# Patient Record
Sex: Female | Born: 1950
Health system: Southern US, Community
[De-identification: ages and names within clinical notes are randomized; demographics above are authoritative.]

## PROBLEM LIST (undated history)

## (undated) DIAGNOSIS — K589 Irritable bowel syndrome without diarrhea: Secondary | ICD-10-CM

## (undated) DIAGNOSIS — K219 Gastro-esophageal reflux disease without esophagitis: Secondary | ICD-10-CM

## (undated) DIAGNOSIS — Z8742 Personal history of other diseases of the female genital tract: Secondary | ICD-10-CM

## (undated) DIAGNOSIS — J45909 Unspecified asthma, uncomplicated: Secondary | ICD-10-CM

## (undated) DIAGNOSIS — M109 Gout, unspecified: Secondary | ICD-10-CM

## (undated) DIAGNOSIS — G4730 Sleep apnea, unspecified: Secondary | ICD-10-CM

## (undated) DIAGNOSIS — R350 Frequency of micturition: Secondary | ICD-10-CM

## (undated) DIAGNOSIS — N393 Stress incontinence (female) (male): Secondary | ICD-10-CM

## (undated) DIAGNOSIS — N951 Menopausal and female climacteric states: Secondary | ICD-10-CM

## (undated) DIAGNOSIS — J3089 Other allergic rhinitis: Secondary | ICD-10-CM

## (undated) DIAGNOSIS — Z9289 Personal history of other medical treatment: Secondary | ICD-10-CM

## (undated) HISTORY — DX: Irritable bowel syndrome, unspecified: K58.9

## (undated) HISTORY — DX: Stress incontinence (female) (male): N39.3

## (undated) HISTORY — DX: Personal history of other medical treatment: Z92.89

## (undated) HISTORY — DX: Sleep apnea, unspecified: G47.30

## (undated) HISTORY — PX: BREAST BIOPSY: SHX20

## (undated) HISTORY — DX: Menopausal and female climacteric states: N95.1

## (undated) HISTORY — PX: TONSILLECTOMY AND ADENOIDECTOMY: SHX28

## (undated) HISTORY — PX: HYSTEROSCOPY WITH D & C: SHX1775

---

## 1999-04-06 ENCOUNTER — Other Ambulatory Visit: Admission: RE | Admit: 1999-04-06 | Discharge: 1999-04-06 | Payer: Self-pay | Admitting: *Deleted

## 2000-05-18 ENCOUNTER — Encounter: Payer: Self-pay | Admitting: Gastroenterology

## 2000-05-18 ENCOUNTER — Ambulatory Visit (HOSPITAL_COMMUNITY): Admission: RE | Admit: 2000-05-18 | Discharge: 2000-05-18 | Payer: Self-pay | Admitting: Gastroenterology

## 2000-06-02 ENCOUNTER — Other Ambulatory Visit: Admission: RE | Admit: 2000-06-02 | Discharge: 2000-06-02 | Payer: Self-pay | Admitting: *Deleted

## 2001-02-07 ENCOUNTER — Other Ambulatory Visit: Admission: RE | Admit: 2001-02-07 | Discharge: 2001-02-07 | Payer: Self-pay | Admitting: *Deleted

## 2001-04-03 ENCOUNTER — Encounter: Admission: RE | Admit: 2001-04-03 | Discharge: 2001-04-03 | Payer: Self-pay | Admitting: Family Medicine

## 2001-04-03 ENCOUNTER — Encounter: Payer: Self-pay | Admitting: Family Medicine

## 2002-04-03 ENCOUNTER — Other Ambulatory Visit: Admission: RE | Admit: 2002-04-03 | Discharge: 2002-04-03 | Payer: Self-pay | Admitting: *Deleted

## 2003-04-17 ENCOUNTER — Other Ambulatory Visit: Admission: RE | Admit: 2003-04-17 | Discharge: 2003-04-17 | Payer: Self-pay | Admitting: *Deleted

## 2004-04-20 ENCOUNTER — Other Ambulatory Visit: Admission: RE | Admit: 2004-04-20 | Discharge: 2004-04-20 | Payer: Self-pay | Admitting: Obstetrics and Gynecology

## 2005-11-21 DIAGNOSIS — N951 Menopausal and female climacteric states: Secondary | ICD-10-CM

## 2005-11-21 HISTORY — DX: Menopausal and female climacteric states: N95.1

## 2006-02-02 ENCOUNTER — Other Ambulatory Visit: Admission: RE | Admit: 2006-02-02 | Discharge: 2006-02-02 | Payer: Self-pay | Admitting: Obstetrics & Gynecology

## 2006-05-03 DIAGNOSIS — Z9289 Personal history of other medical treatment: Secondary | ICD-10-CM

## 2006-05-03 HISTORY — DX: Personal history of other medical treatment: Z92.89

## 2007-02-05 ENCOUNTER — Other Ambulatory Visit: Admission: RE | Admit: 2007-02-05 | Discharge: 2007-02-05 | Payer: Self-pay | Admitting: Obstetrics & Gynecology

## 2007-11-29 ENCOUNTER — Other Ambulatory Visit: Admission: RE | Admit: 2007-11-29 | Discharge: 2007-11-29 | Payer: Self-pay | Admitting: Obstetrics & Gynecology

## 2008-12-01 ENCOUNTER — Other Ambulatory Visit: Admission: RE | Admit: 2008-12-01 | Discharge: 2008-12-01 | Payer: Self-pay | Admitting: Obstetrics and Gynecology

## 2009-01-19 HISTORY — PX: COLONOSCOPY: SHX174

## 2009-05-04 ENCOUNTER — Encounter: Admission: RE | Admit: 2009-05-04 | Discharge: 2009-05-04 | Payer: Self-pay | Admitting: Gastroenterology

## 2009-07-20 ENCOUNTER — Encounter: Payer: Self-pay | Admitting: Obstetrics and Gynecology

## 2009-07-20 ENCOUNTER — Ambulatory Visit (HOSPITAL_COMMUNITY): Admission: RE | Admit: 2009-07-20 | Discharge: 2009-07-20 | Payer: Self-pay | Admitting: Obstetrics and Gynecology

## 2010-03-04 IMAGING — RF DG BE W/ AIR HIGH DENSITY
17 of 24 series · 17 of 24 positions shown · IV contrast (agent unspecified)
Comparison: None.

CLINICAL DATA: Incomplete colonoscopy.  Intermittent constipation.

AIR-CONTRAST BARIUM ENEMA
TECHNIQUE: After obtaining a scout radiograph air-contrast barium
enema was performed under fluoroscopy using high-density barium.
Fluoroscopy Time: 1.7 minutes.
Contrast: Double contrast.

[Series 1: run · 1 of 1 slices shown (1 of 15)]
[im 1/1]
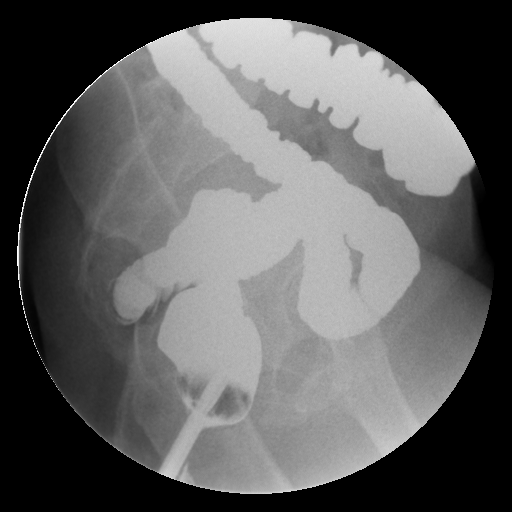

[Series 3: run · 1 of 1 slices shown (2 of 15)]
[im 1/1]
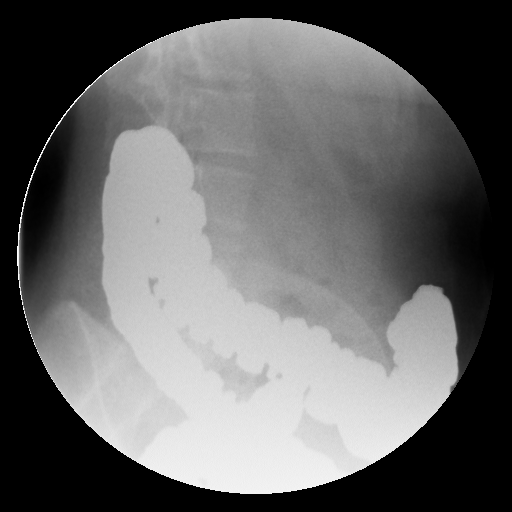

[Series 4: run · 1 of 1 slices shown (3 of 15)]
[im 1/1]
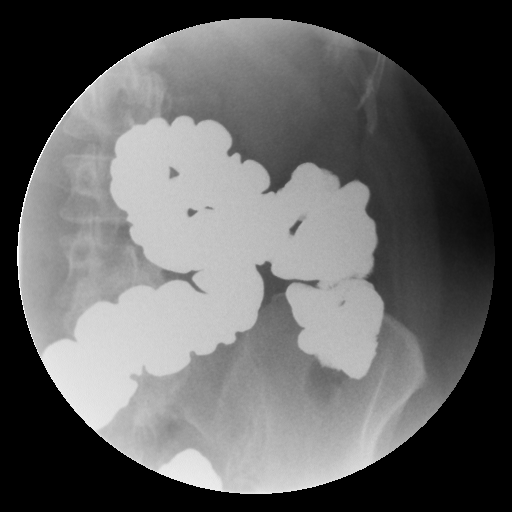

[Series 5: run · 1 of 1 slices shown (4 of 15)]
[im 1/1]
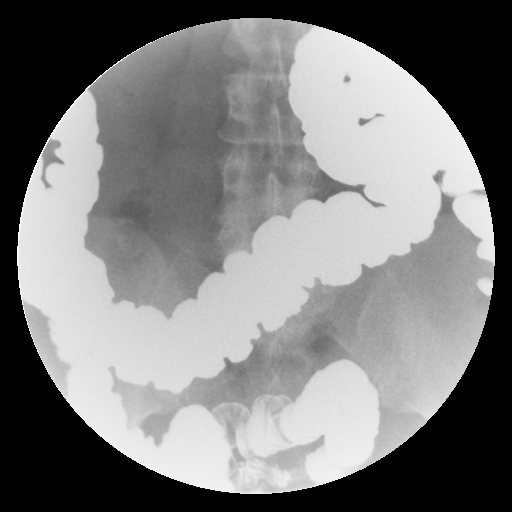

[Series 7: run · 1 of 1 slices shown (5 of 15)]
[im 1/1]
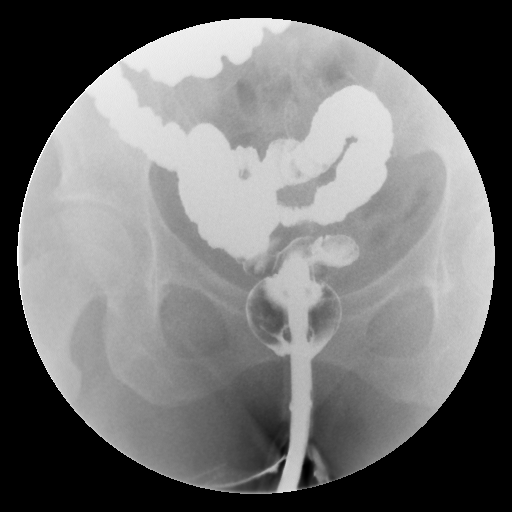

[Series 8: run · 1 of 1 slices shown (6 of 15)]
[im 1/1]
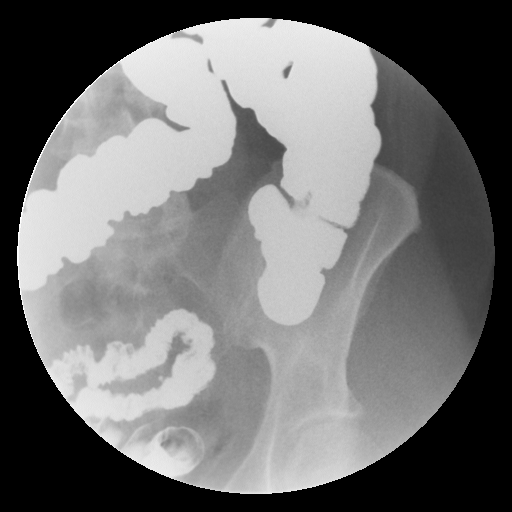

[Series 10: run · 1 of 1 slices shown (7 of 15)]
[im 1/1]
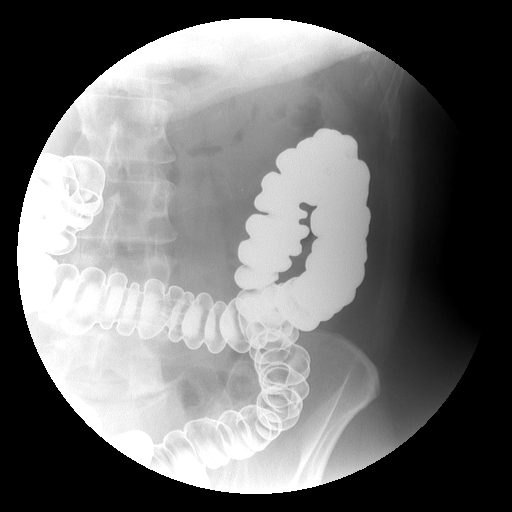

[Series 11: run · 1 of 1 slices shown (8 of 15)]
[im 1/1]
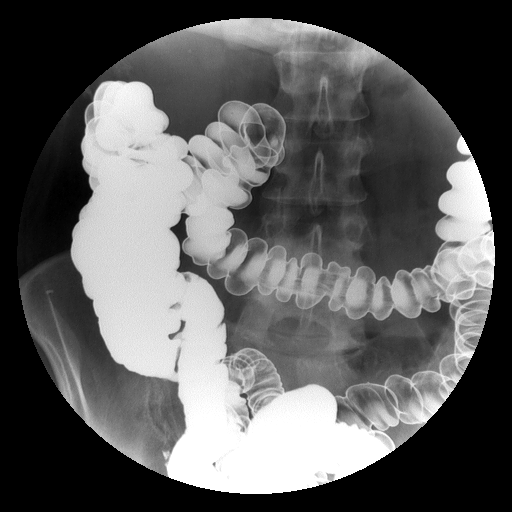

[Series 13: run · 1 of 1 slices shown (9 of 15)]
[im 1/1]
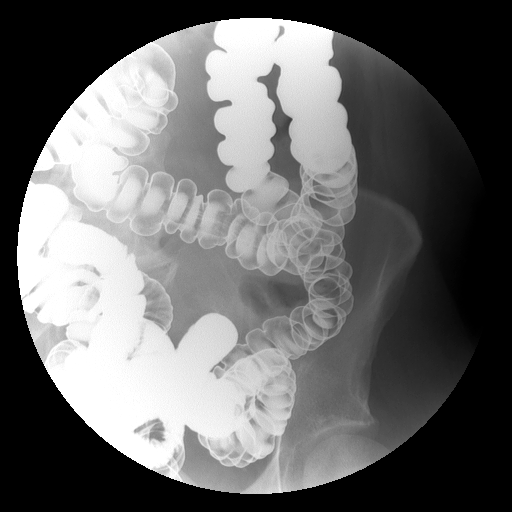

[Series 14: run · 1 of 1 slices shown (10 of 15)]
[im 1/1]
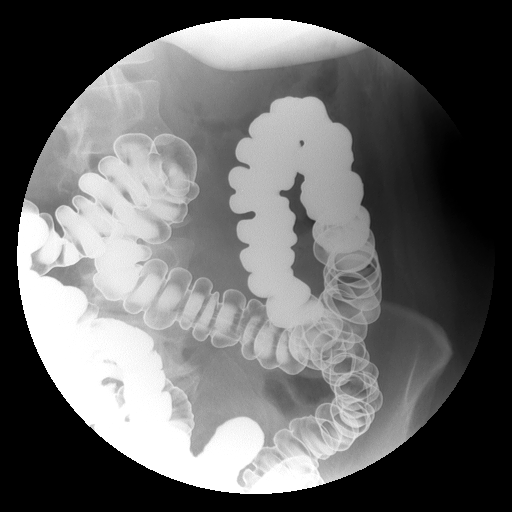

[Series 15: run · 1 of 1 slices shown (11 of 15)]
[im 1/1]
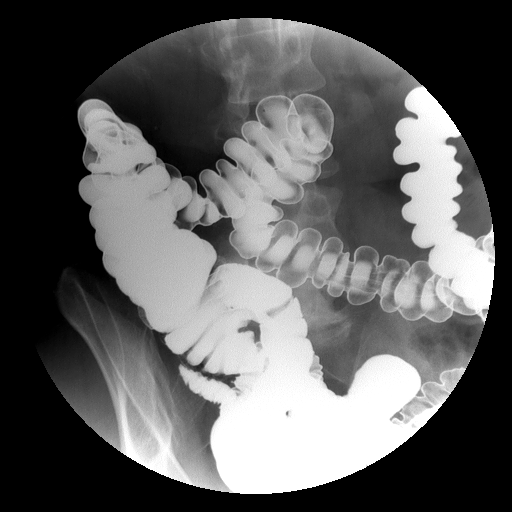

[Series 17: run · 1 of 1 slices shown (12 of 15)]
[im 1/1]
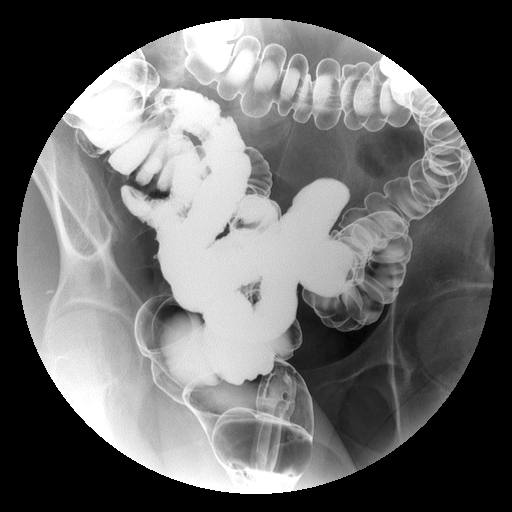

[Series 18: run · 1 of 1 slices shown (13 of 15)]
[im 1/1]
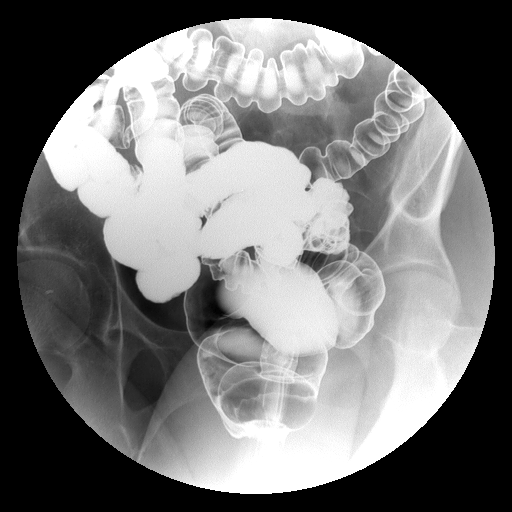

[Series 20: run · 1 of 1 slices shown (14 of 15)]
[im 1/1]
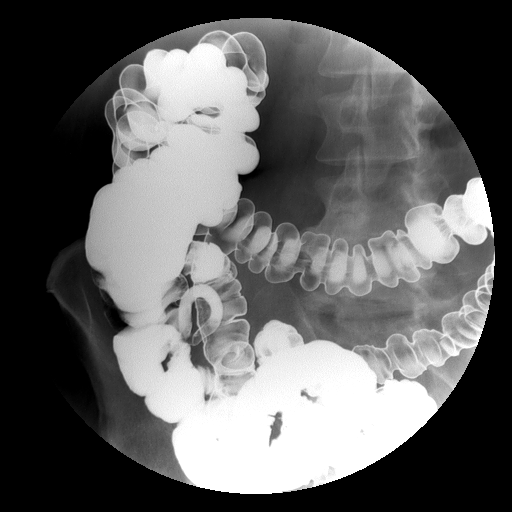

[Series 21: run · 1 of 1 slices shown (15 of 15)]
[im 1/1]
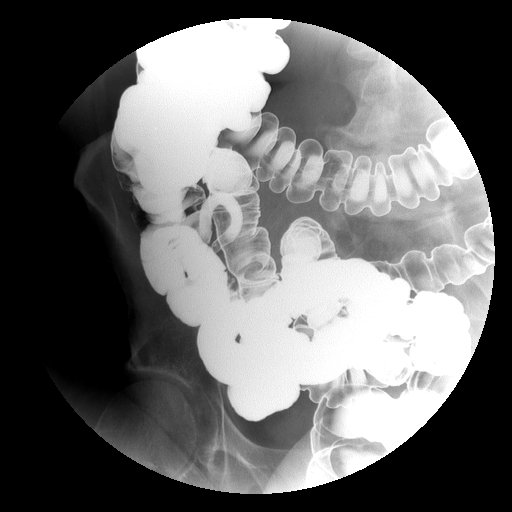

[Series 1001: view not recorded · 0.20mm/px · 1 of 1 slices shown (1 of 2)]
[im 1/1]
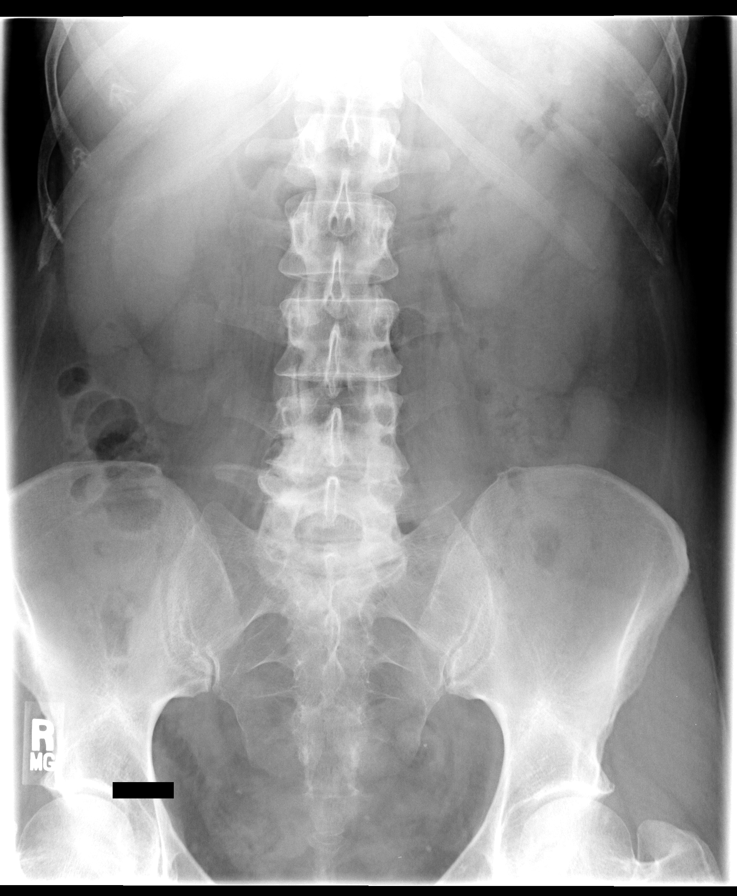

[Series 1003: view not recorded · 0.20mm/px · 1 of 1 slices shown (2 of 2)]
[im 1/1]
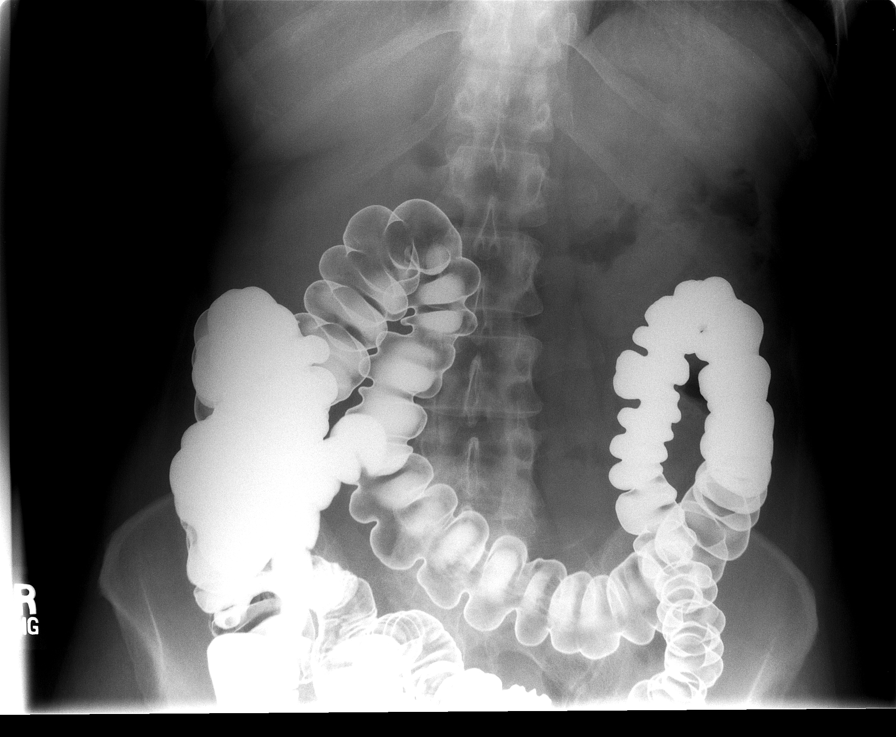

[17 of 24 positions shown; findings below may reference images not displayed]

FINDINGS: Scout view reveals degenerative changes lower lumbar
spine. Barium and air were instilled per rectum with free flow to
the level of the cecum with reflux into a normal-appearing appendix
and terminal ileum.  Secondary to incompetent ileocecal valve and
the fact that the  patient had difficulty retaining air/barium,
adequate distended views were not able to be obtained.  Taking this
limitation to account, no colonic obstructing lesion, constricting
lesion or mucosal abnormality is noted.  Small number scattered
colonic diverticula.
IMPRESSION: Slightly limited exam as noted above.  Small number of colonic
diverticula otherwise no discrete colonic abnormality detected.

## 2011-02-26 LAB — CBC
HCT: 42.7 % (ref 36.0–46.0)
Hemoglobin: 14.4 g/dL (ref 12.0–15.0)
MCHC: 33.7 g/dL (ref 30.0–36.0)
MCV: 89.7 fL (ref 78.0–100.0)
Platelets: 197 10*3/uL (ref 150–400)
RBC: 4.76 MIL/uL (ref 3.87–5.11)
RDW: 12.9 % (ref 11.5–15.5)
WBC: 6.9 10*3/uL (ref 4.0–10.5)

## 2011-04-05 NOTE — Op Note (Signed)
Kristen Anderson, Kristen Anderson              ACCOUNT NO.:  [id number]   MEDICAL RECORD NO.:  [id number]          PATIENT TYPE:  AMB   LOCATION:  [facility]                           FACILITY:  [facility]   PHYSICIAN:  Cynthia P. Romine, M.D.DATE OF BIRTH:  03/06/1951   DATE OF PROCEDURE:  07/20/2009  DATE OF DISCHARGE:  07/20/2009                               OPERATIVE REPORT   PREOPERATIVE DIAGNOSES:  Postmenopausal bleeding, endocervical polyps.   POSTOPERATIVE DIAGNOSES:  Postmenopausal bleeding, endocervical polyps.   PATHOLOGY:  Pending.   PROCEDURES:  Hysteroscopy with resection of endocervical polyp and  dilatation and curettage.   SURGEON:  Cynthia P. Romine, MD   ANESTHESIA:  General by LMA.   ESTIMATED BLOOD LOSS:  50 mL.   COMPLICATIONS:  None.   SORBITOL DEFICIT:  40 mL.   PROCEDURE IN DETAILS:  The patient was taken to the operating room and  after induction of adequate general anesthesia by LMA, she was placed in  a dorsal lithotomy position and prepped and draped in usual fashion.  Catheterization was not carried out as the patient had just weighted  spontaneously before coming back to the OR.  A posterior weighted and  anterior Sims retractor were placed.  The cervix was grasped on its  anterior lip with a single-tooth tenaculum.  Paracervical block was  instituted by injecting 10 mL of 1% Xylocaine at each of 3 and 9  o'clock.  The cervix was dilated to a #31 Pratt.  The uterus sounded to  9 cm.  The operative hysteroscope was introduced.  Glycine was used as a  distention medium with a pressure pump set at 80 mmHg.  Hysteroscopy  revealed the endometrial cavity to be clean and quite atrophic looking,  photographic documentation was taken.  Upon withdrawing the scope, the  endocervical cavity appeared clear until right at the external os.  There was a tuft of sessile endocervical polyps that looked precisely  like we had seen them on the sonohysterogram in the office.  Predominantly, they originated from the anterior surface of the  endocervical canal right at the os.  It was almost difficult to see them  without having the scope withdraw from the cervix, because of how distal  they were.  Single-loop cautery was used to remove these polyps and a  specimen was sent to Pathology.  The scope was then withdrawn.  Gentle  sharp curettage was then done and the specimen sent with the  endocervical polyps to Pathology.  Because of cautery being used, the  cervix so close to os, there was concern about the healing closed,  therefore a pediatric Foley catheter was inserted into the os.  The tip  of the catheter was cut off though just the balloon went into the  endocervix.  The balloon was inflated with 1.5 mL of saline.  At the  other end of the catheter, the tip that would  ordinarily hook up to a bag was cut off to decrease the bulk of the  catheter and then the catheter was wound up and left inside her vagina.  All of  the instruments were removed from the vagina and the procedure  was terminated.  The patient tolerated it well, went in satisfactory  condition to Post Anesthesia Recovery.      Cynthia P. Romine, M.D.  Electronically Signed     CPR/MEDQ  D:  07/20/2009  T:  07/21/2009  Job:  [id number]

## 2012-02-02 LAB — HM PAP SMEAR: HM Pap smear: NEGATIVE

## 2012-11-03 ENCOUNTER — Encounter: Payer: Self-pay | Admitting: *Deleted

## 2012-12-27 LAB — HM MAMMOGRAPHY

## 2013-02-04 ENCOUNTER — Encounter: Payer: Self-pay | Admitting: Nurse Practitioner

## 2013-02-05 ENCOUNTER — Encounter: Payer: Self-pay | Admitting: Obstetrics & Gynecology

## 2013-02-05 ENCOUNTER — Ambulatory Visit: Payer: Self-pay | Admitting: Nurse Practitioner

## 2013-02-05 ENCOUNTER — Ambulatory Visit (INDEPENDENT_AMBULATORY_CARE_PROVIDER_SITE_OTHER): Payer: 59 | Admitting: Obstetrics & Gynecology

## 2013-02-05 ENCOUNTER — Other Ambulatory Visit: Payer: Self-pay | Admitting: Obstetrics & Gynecology

## 2013-02-05 VITALS — BP 120/80 | Ht 67.5 in | Wt 187.0 lb

## 2013-02-05 DIAGNOSIS — Z0000 Encounter for general adult medical examination without abnormal findings: Secondary | ICD-10-CM

## 2013-02-05 DIAGNOSIS — Z01419 Encounter for gynecological examination (general) (routine) without abnormal findings: Secondary | ICD-10-CM

## 2013-02-05 DIAGNOSIS — N959 Unspecified menopausal and perimenopausal disorder: Secondary | ICD-10-CM

## 2013-02-05 LAB — POCT URINALYSIS DIPSTICK
Blood, UA: NEGATIVE
Nitrite, UA: NEGATIVE
Urobilinogen, UA: NEGATIVE

## 2013-02-05 MED ORDER — ESTRADIOL 1 MG PO TABS: 0.5000 mg | ORAL_TABLET | Freq: Every day | ORAL | Status: DC

## 2013-02-05 MED ORDER — MEDROXYPROGESTERONE ACETATE 2.5 MG PO TABS: 2.5000 mg | ORAL_TABLET | Freq: Every day | ORAL | Status: DC
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
## 2013-02-06 LAB — VITAMIN D 25 HYDROXY (VIT D DEFICIENCY, FRACTURES): Vit D, 25-Hydroxy: 37 ng/mL (ref 30–89)

## 2013-02-06 LAB — HEMOGLOBIN, FINGERSTICK: Hemoglobin, fingerstick: 14.9 g/dL (ref 12.0–16.0)

## 2013-02-15 ENCOUNTER — Other Ambulatory Visit: Payer: Self-pay | Admitting: *Deleted

## 2013-02-15 MED ORDER — VITAMIN D (ERGOCALCIFEROL) 1.25 MG (50000 UNIT) PO CAPS: 50000.0000 [IU] | ORAL_CAPSULE | ORAL | Status: DC

## 2013-06-24 ENCOUNTER — Telehealth: Payer: Self-pay | Admitting: Obstetrics & Gynecology

## 2013-06-24 NOTE — Telephone Encounter (Signed)
Left message on CB# of need to return call to our office.for Rx refill.

## 2013-06-24 NOTE — Telephone Encounter (Signed)
Patient wants someone to call her back regarding RX. Needs refill . But, doesn't know what the name is. Please call her back ASAP.

## 2013-06-25 NOTE — Telephone Encounter (Signed)
Patient calling to request change in her medications. Last AEX  01/26/2013 EPIC with Dr. Hyacinth Meeker. At that visit patient chose to wean herself off of Estradiol and Provera, which she has been doing but is finding that is not working for her. Insurance wise is not working either. Will only dispense 15 tablets for her . Patient last AEX with Shirlyn Goltz 02/02/2012 paper chart. Patient request to go back on Estradiol 5mg   Qday with month/3refill. patient request to go back on Provera  1 mg Qday  month/3 refill [facility] [address]

## 2013-06-26 MED ORDER — MEDROXYPROGESTERONE ACETATE 5 MG PO TABS: 5.0000 mg | ORAL_TABLET | Freq: Every day | ORAL | Status: DC

## 2013-06-26 MED ORDER — ESTRADIOL 1 MG PO TABS: 1.0000 mg | ORAL_TABLET | Freq: Every day | ORAL | Status: DC

## 2013-06-26 NOTE — Telephone Encounter (Signed)
Since patient not doing well on taper and trying to come off HRT - will go back to regular dosing and try again later.  She can have refill on Estradiol 1 mg daily with instructions to take 1/2 tablet, and refill on Provera 5 mg with taking 1/2 tab daily for a year.

## 2013-06-26 NOTE — Telephone Encounter (Signed)
Patient notified of Shirlyn Goltz response of medication Estradiol  And Provera  With instructions to take 1/2 tablet daily. Patient frustrated that this is not being took care of the way she requested  To take Estradiol and provera one tablet daily. Does not want to try to wean off. Very stressful at work and needs to have the medication one tablet daily. Please advise.

## 2013-06-26 NOTE — Telephone Encounter (Signed)
Explained to patient reasoning of doses of Estradiol and Provera of higher doses in order to cut in half to make her dose be what she was on last year before weaning herself off.  Patient understands reasoning now and is aware to cut tablets in half.

## 2014-02-06 ENCOUNTER — Ambulatory Visit: Payer: 59 | Admitting: Obstetrics & Gynecology

## 2014-02-18 ENCOUNTER — Encounter: Payer: Self-pay | Admitting: Nurse Practitioner

## 2014-02-18 ENCOUNTER — Ambulatory Visit (INDEPENDENT_AMBULATORY_CARE_PROVIDER_SITE_OTHER): Payer: 59 | Admitting: Nurse Practitioner

## 2014-02-18 VITALS — BP 116/78 | HR 64 | Ht 67.5 in | Wt 191.0 lb

## 2014-02-18 DIAGNOSIS — M949 Disorder of cartilage, unspecified: Secondary | ICD-10-CM

## 2014-02-18 DIAGNOSIS — M8580 Other specified disorders of bone density and structure, unspecified site: Secondary | ICD-10-CM

## 2014-02-18 DIAGNOSIS — Z01419 Encounter for gynecological examination (general) (routine) without abnormal findings: Secondary | ICD-10-CM

## 2014-02-18 DIAGNOSIS — Z0000 Encounter for general adult medical examination without abnormal findings: Secondary | ICD-10-CM

## 2014-02-18 DIAGNOSIS — M899 Disorder of bone, unspecified: Secondary | ICD-10-CM

## 2014-02-18 DIAGNOSIS — Z1211 Encounter for screening for malignant neoplasm of colon: Secondary | ICD-10-CM

## 2014-02-18 LAB — POCT URINALYSIS DIPSTICK
Bilirubin, UA: NEGATIVE
Blood, UA: NEGATIVE
GLUCOSE UA: NEGATIVE
KETONES UA: NEGATIVE
Leukocytes, UA: NEGATIVE
Nitrite, UA: NEGATIVE
Protein, UA: NEGATIVE
Urobilinogen, UA: NEGATIVE
pH, UA: 5

## 2014-02-18 LAB — COMPREHENSIVE METABOLIC PANEL
ALT: 15 U/L (ref 0–35)
AST: 20 U/L (ref 0–37)
Albumin: 4.2 g/dL (ref 3.5–5.2)
Alkaline Phosphatase: 71 U/L (ref 39–117)
BUN: 11 mg/dL (ref 6–23)
CALCIUM: 9.2 mg/dL (ref 8.4–10.5)
CHLORIDE: 104 meq/L (ref 96–112)
CO2: 28 mEq/L (ref 19–32)
Creat: 0.68 mg/dL (ref 0.50–1.10)
GLUCOSE: 97 mg/dL (ref 70–99)
POTASSIUM: 4.3 meq/L (ref 3.5–5.3)
Sodium: 138 mEq/L (ref 135–145)
Total Bilirubin: 0.3 mg/dL (ref 0.2–1.2)
Total Protein: 6.3 g/dL (ref 6.0–8.3)

## 2014-02-18 LAB — LIPID PANEL
Cholesterol: 205 mg/dL — ABNORMAL HIGH (ref 0–200)
HDL: 70 mg/dL (ref >39)
LDL Cholesterol: 122 mg/dL — ABNORMAL HIGH (ref 0–99)
TRIGLYCERIDES: 66 mg/dL (ref <150)
Total CHOL/HDL Ratio: 2.9 Ratio
VLDL: 13 mg/dL (ref 0–40)

## 2014-02-18 LAB — VITAMIN D 25 HYDROXY (VIT D DEFICIENCY, FRACTURES): Vit D, 25-Hydroxy: 33 ng/mL (ref 30–89)

## 2014-02-18 LAB — TSH: TSH: 2.087 u[IU]/mL (ref 0.350–4.500)

## 2014-02-18 LAB — HEMOGLOBIN, FINGERSTICK: Hemoglobin, fingerstick: 13.1 g/dL (ref 12.0–16.0)

## 2014-02-18 NOTE — Progress Notes (Signed)
Reviewed personally.  M. Suzanne Madison Albea, MD.  
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
## 2014-02-18 NOTE — Patient Instructions (Signed)

## 2014-02-19 ENCOUNTER — Encounter: Payer: Self-pay | Admitting: Nurse Practitioner

## 2014-02-20 ENCOUNTER — Other Ambulatory Visit: Payer: Self-pay | Admitting: Nurse Practitioner

## 2014-02-20 DIAGNOSIS — Z1211 Encounter for screening for malignant neoplasm of colon: Secondary | ICD-10-CM

## 2014-02-20 NOTE — Addendum Note (Signed)
Addended by: Antonietta Barcelona on: 02/20/2014 09:08 AM   Modules accepted: Orders

## 2014-07-07 ENCOUNTER — Other Ambulatory Visit: Payer: Self-pay | Admitting: *Deleted

## 2014-07-07 MED ORDER — ESTRADIOL 1 MG PO TABS: 1.0000 mg | ORAL_TABLET | Freq: Every day | ORAL | Status: DC

## 2014-07-07 MED ORDER — MEDROXYPROGESTERONE ACETATE 5 MG PO TABS: 5.0000 mg | ORAL_TABLET | Freq: Every day | ORAL | Status: DC

## 2014-07-07 NOTE — Telephone Encounter (Signed)
Fax From: [facility] for -Estradiol 1 mg  -Medroxyprogesterone 5 mg Both Refilled:  06/26/13 #90/3 refills  Aex Scheduled: 03/02/15  Last mammogram: 01/22/14 Benign  Left Message To Call Back to see if patient is still talking HRT and needs refill?

## 2014-07-07 NOTE — Telephone Encounter (Signed)
Faxed fax stating that rx has been sent electronically.

## 2014-07-07 NOTE — Telephone Encounter (Signed)
S/w patient she said she is needing a refill is cutting her pill in half.  Okay to refill?

## 2014-09-22 ENCOUNTER — Encounter: Payer: Self-pay | Admitting: Nurse Practitioner

## 2015-02-02 ENCOUNTER — Telehealth: Payer: Self-pay | Admitting: Nurse Practitioner

## 2015-02-02 NOTE — Telephone Encounter (Signed)
Spoke with patient. Patient states "I am not sure if I need to be fasting for my labs at my annual. Some times I need to and other times I do not." Advised per review of lab work from 01/2014 she had a slight elevation of her total cholesterol. Advised rechecking this would be beneficial and recommended. Advised this is a fasting lab and could be done at her aex if she would like. Patient is agreeable. Advised patient to make sure to mention to Milford Cage, FNP at appointment that she fasted for lab work and mention anything additional she may want so Milford Cage, FNP can place orders. Patient is agreeable.  Routing to provider for final review. Patient agreeable to disposition. Will close encounter

## 2015-02-02 NOTE — Telephone Encounter (Signed)
Patient has an aex 04/10/15. Patient is asking if she is due for fasting labs? Last seen 02/18/14.

## 2015-03-02 ENCOUNTER — Ambulatory Visit: Payer: 59 | Admitting: Nurse Practitioner

## 2015-04-10 ENCOUNTER — Encounter: Payer: Self-pay | Admitting: Nurse Practitioner

## 2015-04-10 ENCOUNTER — Ambulatory Visit (INDEPENDENT_AMBULATORY_CARE_PROVIDER_SITE_OTHER): Payer: 59 | Admitting: Nurse Practitioner

## 2015-04-10 ENCOUNTER — Telehealth: Payer: Self-pay | Admitting: Nurse Practitioner

## 2015-04-10 VITALS — BP 124/76 | HR 72 | Ht 67.75 in | Wt 197.0 lb

## 2015-04-10 DIAGNOSIS — Z01419 Encounter for gynecological examination (general) (routine) without abnormal findings: Secondary | ICD-10-CM | POA: Diagnosis not present

## 2015-04-10 DIAGNOSIS — Z1211 Encounter for screening for malignant neoplasm of colon: Secondary | ICD-10-CM

## 2015-04-10 DIAGNOSIS — Z0000 Encounter for general adult medical examination without abnormal findings: Secondary | ICD-10-CM

## 2015-04-10 LAB — POCT URINALYSIS DIPSTICK
Bilirubin, UA: NEGATIVE
Blood, UA: NEGATIVE
Glucose, UA: NEGATIVE
Ketones, UA: NEGATIVE
Leukocytes, UA: NEGATIVE
Nitrite, UA: NEGATIVE
PROTEIN UA: NEGATIVE
Urobilinogen, UA: NEGATIVE
pH, UA: 8

## 2015-04-10 LAB — COMPREHENSIVE METABOLIC PANEL
ALBUMIN: 4 g/dL (ref 3.5–5.2)
ALK PHOS: 76 U/L (ref 39–117)
ALT: 18 U/L (ref 0–35)
AST: 22 U/L (ref 0–37)
BILIRUBIN TOTAL: 0.5 mg/dL (ref 0.2–1.2)
BUN: 13 mg/dL (ref 6–23)
CO2: 27 mEq/L (ref 19–32)
CREATININE: 0.71 mg/dL (ref 0.50–1.10)
Calcium: 9.5 mg/dL (ref 8.4–10.5)
Chloride: 104 mEq/L (ref 96–112)
Glucose, Bld: 88 mg/dL (ref 70–99)
Potassium: 4.4 mEq/L (ref 3.5–5.3)
Sodium: 140 mEq/L (ref 135–145)
Total Protein: 6.6 g/dL (ref 6.0–8.3)

## 2015-04-10 LAB — LIPID PANEL
Cholesterol: 194 mg/dL (ref 0–200)
HDL: 73 mg/dL (ref >=46)
LDL CALC: 102 mg/dL — AB (ref 0–99)
Total CHOL/HDL Ratio: 2.7 Ratio
Triglycerides: 97 mg/dL (ref <150)
VLDL: 19 mg/dL (ref 0–40)

## 2015-04-10 LAB — TSH: TSH: 1.629 u[IU]/mL (ref 0.350–4.500)

## 2015-04-10 MED ORDER — ZOSTER VACCINE LIVE 19400 UNT/0.65ML ~~LOC~~ SOLR: 0.6500 mL | Freq: Once | SUBCUTANEOUS | Status: DC

## 2015-04-10 NOTE — Patient Instructions (Signed)

## 2015-04-10 NOTE — Telephone Encounter (Signed)
Prescription signed by Milford Cage, FNP.   Patient would like rx mailed, however, cannot mail rx patient must pick up if would like paper rx per Nursing supervisor, Lamont Snowball, RN.   Called patient, advised rx sent electrocially to [facility] [address]. Patient agreeable. Will call back with any concerns. Routing to provider for final review. Patient agreeable to disposition. Will close encounter.
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
## 2015-04-10 NOTE — Telephone Encounter (Signed)
Patient forgot to get "shingles vaccine" prescription, was not sure id Patty had changed her mind. Last seen today.

## 2015-04-10 NOTE — Telephone Encounter (Signed)
Patty, okay for shingles vaccine to be sent to [facility]?

## 2015-04-11 LAB — VITAMIN D 25 HYDROXY (VIT D DEFICIENCY, FRACTURES): Vit D, 25-Hydroxy: 21 ng/mL — ABNORMAL LOW (ref 30–100)

## 2015-04-12 NOTE — Progress Notes (Signed)
Encounter reviewed by Dr. Brook Silva.  

## 2015-04-13 LAB — HEMOGLOBIN, FINGERSTICK: HEMOGLOBIN, FINGERSTICK: 13.9 g/dL (ref 12.0–16.0)

## 2015-04-14 LAB — IPS PAP TEST WITH HPV

## 2015-05-05 LAB — FECAL OCCULT BLOOD, IMMUNOCHEMICAL: IMMUNOLOGICAL FECAL OCCULT BLOOD TEST: NEGATIVE

## 2015-05-05 NOTE — Addendum Note (Signed)
Addended by: Susanne Greenhouse E on: 05/05/2015 12:01 PM   Modules accepted: Orders

## 2015-08-31 ENCOUNTER — Ambulatory Visit
Admission: RE | Admit: 2015-08-31 | Discharge: 2015-08-31 | Disposition: A | Discharge status: Home / Self Care | Payer: 59 | Source: Ambulatory Visit | Attending: Family Medicine | Admitting: Family Medicine

## 2015-08-31 ENCOUNTER — Other Ambulatory Visit: Payer: Self-pay | Admitting: Family Medicine

## 2015-08-31 DIAGNOSIS — M79672 Pain in left foot: Secondary | ICD-10-CM

## 2016-02-02 ENCOUNTER — Telehealth: Payer: Self-pay | Admitting: Nurse Practitioner

## 2016-02-02 NOTE — Telephone Encounter (Signed)
Please let pt know that BMD done on 01/20/2016 shows a T Score at the spine +0.30, right hip neck at -0.50; left hip neck -0.7.  All measurements fall in the normal range.  Good job on exercise, weight bearing, calcium and Vit D support.  Repeat again in 3-4 yrs.

## 2016-03-16 NOTE — Telephone Encounter (Signed)
Pt notified of results and recommendations.  Pt voices understanding of results.  Patient has annual exam 04/13/16 and will ask any follow up questions at that time.  Encouraged to call before annual if she has an immediate question.

## 2016-04-13 ENCOUNTER — Encounter: Payer: Self-pay | Admitting: Nurse Practitioner

## 2016-04-13 ENCOUNTER — Ambulatory Visit (INDEPENDENT_AMBULATORY_CARE_PROVIDER_SITE_OTHER): Payer: 59 | Admitting: Nurse Practitioner

## 2016-04-13 VITALS — BP 136/84 | HR 60 | Ht 67.25 in | Wt 200.0 lb

## 2016-04-13 DIAGNOSIS — Z818 Family history of other mental and behavioral disorders: Secondary | ICD-10-CM | POA: Diagnosis not present

## 2016-04-13 DIAGNOSIS — Z01419 Encounter for gynecological examination (general) (routine) without abnormal findings: Secondary | ICD-10-CM

## 2016-04-13 DIAGNOSIS — K649 Unspecified hemorrhoids: Secondary | ICD-10-CM | POA: Diagnosis not present

## 2016-04-13 DIAGNOSIS — Z0000 Encounter for general adult medical examination without abnormal findings: Secondary | ICD-10-CM

## 2016-04-13 LAB — CBC WITH DIFFERENTIAL/PLATELET
Basophils Absolute: 64 cells/uL (ref 0–200)
Basophils Relative: 1 %
EOS PCT: 3 %
Eosinophils Absolute: 192 cells/uL (ref 15–500)
HEMATOCRIT: 44.2 % (ref 35.0–45.0)
Hemoglobin: 14.6 g/dL (ref 11.7–15.5)
LYMPHS PCT: 38 %
Lymphs Abs: 2432 cells/uL (ref 850–3900)
MCH: 28.7 pg (ref 27.0–33.0)
MCHC: 33 g/dL (ref 32.0–36.0)
MCV: 86.8 fL (ref 80.0–100.0)
MONO ABS: 448 {cells}/uL (ref 200–950)
MPV: 10.3 fL (ref 7.5–12.5)
Monocytes Relative: 7 %
NEUTROS PCT: 51 %
Neutro Abs: 3264 cells/uL (ref 1500–7800)
Platelets: 183 10*3/uL (ref 140–400)
RBC: 5.09 MIL/uL (ref 3.80–5.10)
RDW: 14.2 % (ref 11.0–15.0)
WBC: 6.4 10*3/uL (ref 3.8–10.8)

## 2016-04-13 LAB — HIV ANTIBODY (ROUTINE TESTING W REFLEX): HIV: NONREACTIVE

## 2016-04-13 MED ORDER — HYDROCORTISONE 2.5 % RE CREA: 1.0000 "application " | TOPICAL_CREAM | Freq: Two times a day (BID) | RECTAL | Status: DC

## 2016-04-13 NOTE — Patient Instructions (Signed)
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
## 2016-04-14 LAB — COMPREHENSIVE METABOLIC PANEL
ALBUMIN: 4.4 g/dL (ref 3.6–5.1)
ALK PHOS: 73 U/L (ref 33–130)
ALT: 26 U/L (ref 6–29)
AST: 25 U/L (ref 10–35)
BILIRUBIN TOTAL: 0.4 mg/dL (ref 0.2–1.2)
BUN: 12 mg/dL (ref 7–25)
CALCIUM: 9.6 mg/dL (ref 8.6–10.4)
CO2: 24 mmol/L (ref 20–31)
CREATININE: 0.66 mg/dL (ref 0.50–0.99)
Chloride: 105 mmol/L (ref 98–110)
Glucose, Bld: 91 mg/dL (ref 65–99)
Potassium: 4.3 mmol/L (ref 3.5–5.3)
Sodium: 141 mmol/L (ref 135–146)
Total Protein: 6.9 g/dL (ref 6.1–8.1)

## 2016-04-14 LAB — VITAMIN D 25 HYDROXY (VIT D DEFICIENCY, FRACTURES): Vit D, 25-Hydroxy: 22 ng/mL — ABNORMAL LOW (ref 30–100)

## 2016-04-14 LAB — LIPID PANEL
Cholesterol: 243 mg/dL — ABNORMAL HIGH (ref 125–200)
HDL: 78 mg/dL (ref >=46)
LDL CALC: 140 mg/dL — AB (ref <130)
Total CHOL/HDL Ratio: 3.1 Ratio (ref <=5.0)
Triglycerides: 125 mg/dL (ref <150)
VLDL: 25 mg/dL (ref <30)

## 2016-04-14 LAB — TSH: TSH: 2.39 mIU/L

## 2016-04-14 LAB — HEPATITIS C ANTIBODY: HCV Ab: NEGATIVE

## 2016-04-18 NOTE — Progress Notes (Signed)
Encounter reviewed by Dr. Makendra Vigeant Amundson C. Silva.  

## 2016-05-17 ENCOUNTER — Telehealth: Payer: Self-pay | Admitting: Nurse Practitioner

## 2016-05-17 NOTE — Telephone Encounter (Signed)
Patient says she never received a call about her results.

## 2016-05-17 NOTE — Telephone Encounter (Signed)
Spoke with patient. Advised of message and results as seen below from Kem Boroughs, [place]. She is agreeable and verbalizes understanding.  Notes Recorded by Kem Boroughs, FNP on 04/15/2016 at 8:23 AM Results via my chart:  Deb,  The Hep C and HIV was negative as expected. The Vit D was still low at 22 and it would be best for you to take OTC 1000 IU of Vit D daily. Lipid panel shows elevated total cholesterol at 243 compared to 194. The LDL (bad cholesterol) was 140 compared to 102. You still need to maintain a low cholesterol diet. The thyroid, kidney, liver, glucose and CBC is all normal.  Routing to provider for final review. Patient agreeable to disposition. Will close encounter.

## 2017-01-26 ENCOUNTER — Encounter: Payer: Self-pay | Admitting: Nurse Practitioner

## 2017-04-20 ENCOUNTER — Encounter: Payer: Self-pay | Admitting: Nurse Practitioner
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
## 2017-04-24 ENCOUNTER — Other Ambulatory Visit (HOSPITAL_COMMUNITY)
Admission: RE | Admit: 2017-04-24 | Discharge: 2017-04-24 | Disposition: A | Discharge status: Home / Self Care | Payer: 59 | Source: Ambulatory Visit | Attending: Nurse Practitioner | Admitting: Nurse Practitioner

## 2017-04-24 ENCOUNTER — Ambulatory Visit (INDEPENDENT_AMBULATORY_CARE_PROVIDER_SITE_OTHER): Payer: 59 | Admitting: Nurse Practitioner

## 2017-04-24 ENCOUNTER — Encounter: Payer: Self-pay | Admitting: Nurse Practitioner

## 2017-04-24 VITALS — BP 112/66 | HR 68 | Ht 67.5 in | Wt 196.0 lb

## 2017-04-24 DIAGNOSIS — Z0000 Encounter for general adult medical examination without abnormal findings: Secondary | ICD-10-CM | POA: Diagnosis not present

## 2017-04-24 DIAGNOSIS — Z0001 Encounter for general adult medical examination with abnormal findings: Secondary | ICD-10-CM | POA: Insufficient documentation

## 2017-04-24 DIAGNOSIS — Z01411 Encounter for gynecological examination (general) (routine) with abnormal findings: Secondary | ICD-10-CM | POA: Diagnosis not present

## 2017-04-24 DIAGNOSIS — K649 Unspecified hemorrhoids: Secondary | ICD-10-CM | POA: Diagnosis not present

## 2017-04-24 DIAGNOSIS — R87610 Atypical squamous cells of undetermined significance on cytologic smear of cervix (ASC-US): Secondary | ICD-10-CM | POA: Insufficient documentation

## 2017-04-24 DIAGNOSIS — R0600 Dyspnea, unspecified: Secondary | ICD-10-CM

## 2017-04-24 DIAGNOSIS — R0609 Other forms of dyspnea: Secondary | ICD-10-CM | POA: Diagnosis not present

## 2017-04-24 NOTE — Patient Instructions (Signed)

## 2017-04-25 LAB — COMPREHENSIVE METABOLIC PANEL
A/G RATIO: 2.1 (ref 1.2–2.2)
ALT: 25 IU/L (ref 0–32)
AST: 28 IU/L (ref 0–40)
Albumin: 4.4 g/dL (ref 3.6–4.8)
Alkaline Phosphatase: 88 IU/L (ref 39–117)
BILIRUBIN TOTAL: 0.4 mg/dL (ref 0.0–1.2)
BUN / CREAT RATIO: 15 (ref 12–28)
BUN: 12 mg/dL (ref 8–27)
CHLORIDE: 103 mmol/L (ref 96–106)
CO2: 27 mmol/L (ref 18–29)
Calcium: 9.6 mg/dL (ref 8.7–10.3)
Creatinine, Ser: 0.8 mg/dL (ref 0.57–1.00)
GFR, EST AFRICAN AMERICAN: 91 mL/min/{1.73_m2} (ref >59)
GFR, EST NON AFRICAN AMERICAN: 79 mL/min/{1.73_m2} (ref >59)
GLOBULIN, TOTAL: 2.1 g/dL (ref 1.5–4.5)
Glucose: 91 mg/dL (ref 65–99)
POTASSIUM: 4.6 mmol/L (ref 3.5–5.2)
SODIUM: 142 mmol/L (ref 134–144)
TOTAL PROTEIN: 6.5 g/dL (ref 6.0–8.5)

## 2017-04-25 LAB — TSH: TSH: 2.11 u[IU]/mL (ref 0.450–4.500)

## 2017-04-25 LAB — LIPID PANEL
Chol/HDL Ratio: 2.9 ratio (ref 0.0–4.4)
Cholesterol, Total: 208 mg/dL — ABNORMAL HIGH (ref 100–199)
HDL: 72 mg/dL (ref >39)
LDL Calculated: 115 mg/dL — ABNORMAL HIGH (ref 0–99)
TRIGLYCERIDES: 107 mg/dL (ref 0–149)
VLDL CHOLESTEROL CAL: 21 mg/dL (ref 5–40)

## 2017-04-25 LAB — CBC
Hematocrit: 38.6 % (ref 34.0–46.6)
Hemoglobin: 12.4 g/dL (ref 11.1–15.9)
MCH: 25.7 pg — ABNORMAL LOW (ref 26.6–33.0)
MCHC: 32.1 g/dL (ref 31.5–35.7)
MCV: 80 fL (ref 79–97)
Platelets: 202 10*3/uL (ref 150–379)
RBC: 4.82 x10E6/uL (ref 3.77–5.28)
RDW: 15.5 % — AB (ref 12.3–15.4)
WBC: 5.2 10*3/uL (ref 3.4–10.8)

## 2017-04-25 LAB — VITAMIN D 25 HYDROXY (VIT D DEFICIENCY, FRACTURES): Vit D, 25-Hydroxy: 22.8 ng/mL — ABNORMAL LOW (ref 30.0–100.0)

## 2017-04-25 NOTE — Progress Notes (Signed)
Could consider PUS if pap normal and pt wants some additional evaluation.  Reviewed personally.  Felipa Emory, MD.

## 2017-04-27 LAB — CYTOLOGY - PAP
DIAGNOSIS: UNDETERMINED — AB
HPV: NOT DETECTED

## 2017-04-28 ENCOUNTER — Telehealth: Payer: Self-pay | Admitting: Nurse Practitioner

## 2017-04-28 NOTE — Telephone Encounter (Signed)
Return call to patient. States she realized this am that she did not receive Tdap vaccine at annual on 04-24-17. Last Tdap 3-08 per annual exam note.  Advised patient will review with covering provider [facility] out of office) and can schedule for next week. Appointment scheduled for 05-03-17.  Will call her back if any problems with appointment as scheduled.   Routing to Dr Quincy Simmonds for review and orders.

## 2017-04-28 NOTE — Telephone Encounter (Signed)
Agree with TDap.  Appointment closed.

## 2017-04-28 NOTE — Telephone Encounter (Signed)
Patient was seen earlier in the week and states that she thinks Kem Boroughs wanted her to get a Tetanus shot

## 2017-05-03 ENCOUNTER — Ambulatory Visit (INDEPENDENT_AMBULATORY_CARE_PROVIDER_SITE_OTHER): Payer: 59

## 2017-05-03 VITALS — BP 120/80 | HR 60 | Resp 12 | Ht 67.5 in | Wt 197.6 lb

## 2017-05-03 DIAGNOSIS — Z23 Encounter for immunization: Secondary | ICD-10-CM

## 2017-05-03 NOTE — Progress Notes (Signed)
Patient here for Tdap.  Patient tolerated well. Injection given in R Deltoid.  Routed to provider and encounter closed.

## 2017-05-05 ENCOUNTER — Telehealth: Payer: Self-pay | Admitting: Nurse Practitioner

## 2017-05-05 NOTE — Telephone Encounter (Signed)
Pt is called about her ASCUS  Pap with a negative HR HPV.  We also discussed the vaginal pain with SA.  All of this is most likely related to atrophic vaginitis.  I did discuss with Dr. Sabra Heck about next step and she can have a PUS to make sure everything is OK.  Pt will consider - does not want SHGM and EMB that she had done in 2010.  But that was for PMB evaluation and cervical polyp was the cause.   She will think about it and call back if desired. I have asked her to get OTC Replens to use 1-2 times a week and before SA.

## 2017-06-27 ENCOUNTER — Telehealth: Payer: Self-pay | Admitting: Obstetrics & Gynecology

## 2017-06-27 NOTE — Telephone Encounter (Signed)
Left message for patient to reschedule [facility] appointment.

## 2017-07-07 ENCOUNTER — Ambulatory Visit: Payer: Self-pay | Admitting: Cardiovascular Disease

## 2018-01-30 ENCOUNTER — Encounter: Payer: Self-pay | Admitting: Obstetrics & Gynecology

## 2018-02-12 ENCOUNTER — Telehealth: Payer: Self-pay | Admitting: Certified Nurse Midwife

## 2018-02-12 MED ORDER — HYDROCORTISONE 2.5 % RE CREA: 1.0000 "application " | TOPICAL_CREAM | Freq: Two times a day (BID) | RECTAL | 0 refills | Status: DC

## 2018-02-12 NOTE — Telephone Encounter (Signed)
Patient called and requested a refill on proctosol-hc 2.5% to be sent to her pharmacy on file. She said it's been about two years since she filled the prescription and that it's for hemorrhoids.

## 2018-02-12 NOTE — Telephone Encounter (Signed)
Patient has a history of hemorrhoids. Last given Hydrocortisone 2.5 % rectal cream by Kem Boroughs, FNP on 04/13/2016. Last seen in office 04/24/2017 for aex. Requesting refill of medication due to recent flare up of hemorrhoids.

## 2018-02-12 NOTE — Telephone Encounter (Signed)
Proctosol-HC 2.5% apply bid prn hemorrhoid flare. Dispense:  28 gm tube RF none. Sent to pharmacy on file. Patient has been notified and is agreeable. Encounter closed.

## 2018-02-12 NOTE — Telephone Encounter (Signed)
Ok for [employer] 2.5% apply bid prn hemorrhoid flare. Dispense:  28 gm tube RF none.

## 2018-04-26 ENCOUNTER — Telehealth: Payer: Self-pay | Admitting: Obstetrics & Gynecology

## 2018-04-26 ENCOUNTER — Encounter: Payer: Self-pay | Admitting: Certified Nurse Midwife

## 2018-04-26 ENCOUNTER — Telehealth: Payer: Self-pay | Admitting: Certified Nurse Midwife

## 2018-04-26 ENCOUNTER — Ambulatory Visit (INDEPENDENT_AMBULATORY_CARE_PROVIDER_SITE_OTHER): Payer: 59 | Admitting: Certified Nurse Midwife

## 2018-04-26 ENCOUNTER — Other Ambulatory Visit: Payer: Self-pay

## 2018-04-26 ENCOUNTER — Other Ambulatory Visit (HOSPITAL_COMMUNITY)
Admission: RE | Admit: 2018-04-26 | Discharge: 2018-04-26 | Disposition: A | Discharge status: Home / Self Care | Payer: 59 | Source: Ambulatory Visit | Attending: Certified Nurse Midwife | Admitting: Certified Nurse Midwife

## 2018-04-26 VITALS — BP 120/78 | HR 68 | Resp 16 | Ht 67.25 in | Wt 196.0 lb

## 2018-04-26 DIAGNOSIS — F439 Reaction to severe stress, unspecified: Secondary | ICD-10-CM | POA: Diagnosis not present

## 2018-04-26 DIAGNOSIS — N950 Postmenopausal bleeding: Secondary | ICD-10-CM | POA: Diagnosis not present

## 2018-04-26 DIAGNOSIS — Z0000 Encounter for general adult medical examination without abnormal findings: Secondary | ICD-10-CM | POA: Diagnosis not present

## 2018-04-26 DIAGNOSIS — Z01411 Encounter for gynecological examination (general) (routine) with abnormal findings: Secondary | ICD-10-CM | POA: Diagnosis not present

## 2018-04-26 DIAGNOSIS — Z1211 Encounter for screening for malignant neoplasm of colon: Secondary | ICD-10-CM

## 2018-04-26 DIAGNOSIS — E559 Vitamin D deficiency, unspecified: Secondary | ICD-10-CM

## 2018-04-26 DIAGNOSIS — R3915 Urgency of urination: Secondary | ICD-10-CM

## 2018-04-26 DIAGNOSIS — Z124 Encounter for screening for malignant neoplasm of cervix: Secondary | ICD-10-CM

## 2018-04-26 DIAGNOSIS — N952 Postmenopausal atrophic vaginitis: Secondary | ICD-10-CM | POA: Diagnosis not present

## 2018-04-26 NOTE — Telephone Encounter (Signed)
Call placed to patient to review benefits for recommended ultrasound. Patient understood benefit information as presented, but states she wants to wait until August, to have the ultrasound, adding she will have new insurance coverage, Medicare as of 06/21/18. Reviewed benefit additional options with patient. Patient continues to decline scheduling. Patient states she will call back to advise how she wants to proceed.   Routing to Lamont Snowball, RN  cc: Thayer Ohm
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
## 2018-04-26 NOTE — Patient Instructions (Signed)

## 2018-04-26 NOTE — Telephone Encounter (Signed)
error 

## 2018-04-27 LAB — COMPREHENSIVE METABOLIC PANEL
A/G RATIO: 2.2 (ref 1.2–2.2)
ALT: 23 IU/L (ref 0–32)
AST: 28 IU/L (ref 0–40)
Albumin: 4.8 g/dL (ref 3.6–4.8)
Alkaline Phosphatase: 78 IU/L (ref 39–117)
BILIRUBIN TOTAL: 0.5 mg/dL (ref 0.0–1.2)
BUN / CREAT RATIO: 11 — AB (ref 12–28)
BUN: 9 mg/dL (ref 8–27)
CHLORIDE: 103 mmol/L (ref 96–106)
CO2: 24 mmol/L (ref 20–29)
Calcium: 9.9 mg/dL (ref 8.7–10.3)
Creatinine, Ser: 0.84 mg/dL (ref 0.57–1.00)
GFR calc non Af Amer: 74 mL/min/{1.73_m2} (ref >59)
GFR, EST AFRICAN AMERICAN: 85 mL/min/{1.73_m2} (ref >59)
GLOBULIN, TOTAL: 2.2 g/dL (ref 1.5–4.5)
Glucose: 98 mg/dL (ref 65–99)
POTASSIUM: 4.5 mmol/L (ref 3.5–5.2)
SODIUM: 144 mmol/L (ref 134–144)
TOTAL PROTEIN: 7 g/dL (ref 6.0–8.5)

## 2018-04-27 LAB — LIPID PANEL
CHOL/HDL RATIO: 3 ratio (ref 0.0–4.4)
Cholesterol, Total: 243 mg/dL — ABNORMAL HIGH (ref 100–199)
HDL: 80 mg/dL (ref >39)
LDL Calculated: 146 mg/dL — ABNORMAL HIGH (ref 0–99)
TRIGLYCERIDES: 87 mg/dL (ref 0–149)
VLDL Cholesterol Cal: 17 mg/dL (ref 5–40)

## 2018-04-27 LAB — HEMOGLOBIN A1C
Est. average glucose Bld gHb Est-mCnc: 126 mg/dL
Hgb A1c MFr Bld: 6 % — ABNORMAL HIGH (ref 4.8–5.6)

## 2018-04-27 LAB — TSH: TSH: 2.16 u[IU]/mL (ref 0.450–4.500)

## 2018-04-27 LAB — URINE CULTURE

## 2018-04-27 LAB — VITAMIN D 25 HYDROXY (VIT D DEFICIENCY, FRACTURES): VIT D 25 HYDROXY: 28.8 ng/mL — AB (ref 30.0–100.0)

## 2018-04-29 ENCOUNTER — Other Ambulatory Visit: Payer: Self-pay | Admitting: Certified Nurse Midwife

## 2018-04-29 DIAGNOSIS — R899 Unspecified abnormal finding in specimens from other organs, systems and tissues: Secondary | ICD-10-CM

## 2018-05-01 ENCOUNTER — Ambulatory Visit: Payer: 59 | Admitting: Nurse Practitioner

## 2018-05-02 ENCOUNTER — Telehealth: Payer: Self-pay | Admitting: *Deleted

## 2018-05-02 LAB — CYTOLOGY - PAP
DIAGNOSIS: NEGATIVE
HPV (WINDOPATH): NOT DETECTED

## 2018-05-02 NOTE — Telephone Encounter (Signed)
Left message to call back regarding lab results -eh

## 2018-05-03 NOTE — Telephone Encounter (Signed)
Patient called back. Results given  Patient requesting results and result note to be released to My Chart.

## 2018-05-04 ENCOUNTER — Telehealth: Payer: Self-pay | Admitting: *Deleted

## 2018-05-04 NOTE — Telephone Encounter (Signed)
Left message to call Sharee Pimple at [phone].   Reviewed with Melvia Heaps, CNM, call placed to patient. Schedule patient with physician for PUS and possible EMB for further evaluation of PMB. Hx of endometrial polyps.

## 2018-05-05 LAB — FECAL OCCULT BLOOD, IMMUNOCHEMICAL: Fecal Occult Bld: NEGATIVE

## 2018-05-08 NOTE — Telephone Encounter (Signed)
See phone encounter from 05-04-18 regarding patient options.  Patient still undecided on proceeding.   Routing to Dr Sabra Heck. Encounter closed.

## 2018-05-08 NOTE — Telephone Encounter (Addendum)
Spoke with patient, patient declined to schedule PUS with possible EMB. Patient states she has been contacted to review benefits, see account notes and previous telephone encounter dated 04/26/18. Patient requesting to defer scheduling to 06/21/2018 when new insurance is in effect.   Patient is aware PUS and possible EMB for further evaluation of PMB. Patient states she is at lunch, will give it some more thought and return call to office with her decision.   Routing to Dr. Sabra Heck for review.   Cc: Melvia Heaps, CNM, Lamont Snowball, RN

## 2018-05-09 NOTE — Telephone Encounter (Signed)
Pt is clearly aware of recommendation.  Encounter closed.

## 2018-09-06 ENCOUNTER — Telehealth: Payer: Self-pay

## 2018-09-06 NOTE — Telephone Encounter (Signed)
Left detailed message for patient to call & schedule fasting lab appointment. DPR signed.

## 2018-09-12 NOTE — Telephone Encounter (Signed)
Left message for call back.

## 2018-09-14 NOTE — Telephone Encounter (Signed)
No callback from patient or appt scheduled. Okay to close encounter?

## 2018-09-14 NOTE — Telephone Encounter (Signed)
yes

## 2018-09-21 ENCOUNTER — Telehealth: Payer: Self-pay | Admitting: Certified Nurse Midwife

## 2018-09-21 NOTE — Telephone Encounter (Signed)
Call to patient to schedule ultrasound. Patient needs to know benefit information under new insurance before scheduling.  Advised will have business office call her back.

## 2018-09-21 NOTE — Telephone Encounter (Signed)
Patient is now ready to schedule Ultrasound. Patient stated that she had a high deductible and now has better insurance and is ready to schedule. Patient stated that she also needed to get lab work done and would like to get it all done at the same time.

## 2018-09-25 NOTE — Telephone Encounter (Signed)
Spoke with patient regarding benefit for ultrasound. Patient understood and agreeable. Patient ready to schedule. Patient scheduled 09/27/18 with Dr Sabra Heck. Patient aware of appointment date, arrival time and cancellation policy.  No further questions. Ok to close   cc: Lamont Snowball, RN

## 2018-09-27 ENCOUNTER — Ambulatory Visit (INDEPENDENT_AMBULATORY_CARE_PROVIDER_SITE_OTHER): Payer: Medicare HMO

## 2018-09-27 ENCOUNTER — Ambulatory Visit: Payer: Medicare HMO | Admitting: Obstetrics & Gynecology

## 2018-09-27 ENCOUNTER — Other Ambulatory Visit: Payer: Self-pay | Admitting: *Deleted

## 2018-09-27 VITALS — BP 136/80 | HR 64 | Resp 16 | Ht 67.25 in | Wt 187.0 lb

## 2018-09-27 DIAGNOSIS — N950 Postmenopausal bleeding: Secondary | ICD-10-CM

## 2018-09-27 DIAGNOSIS — R9389 Abnormal findings on diagnostic imaging of other specified body structures: Secondary | ICD-10-CM | POA: Diagnosis not present

## 2018-09-27 NOTE — Progress Notes (Signed)
67 y.o. [id number] Married White or Caucasian female here for pelvic ultrasound to evaluate endometrium and possible need for endometrial biopsy after pt had episode of vaginal bleeding in June.  Does have hx of endometrial polyps and had similar bleeding at that time.  This was treated with hysteroscopy with polyp resection.  Patient's last menstrual period was 10/21/2006 (approximate).  Contraception: PMP  Findings:  UTERUS: 6.0 x 4.0 x 3.5cm EMS: 6.24mm ADNEXA: Left ovary: 1.3 x 0.7 x 0.7cm       Right ovary: 1.2 x 0.9 x 0.8cm.  Examination of ovaries was difficult due ot overlying bowel/gas CUL DE SAC: no free fluid  Discussion:  Findings discussed, specifically endometrial thickness and recommendation for endometrial biopsy.  Pt reports this experience was really terrible when done previously.  Does not want to have done today.  Also, wants to know chance that biopsy will be done and additional procedure might be needed like hysteroscopy.  If biopsy showed endometrial polyp or if biopsy was negative but she had PMP bleeding again, then hysteroscopy would be indicated but exact change of this is difficult to determine.  She would like to know cost of hysteroscopy as she may just decide to proceed with this instead of biopsy and then possibly additional treatment/therapy.  Will need to precert this for her.  Will ask that this be done.  If cost is high and pt decides to return for PUS, will consider oral sedation and paracervical block prior to biopsy.  Pt comfortable with this plan.  Assessment:  PMP bleeding in June with thickened endometrium on PUS today.  Plan:  Will precert hysteroscopy and if this is not possible, will consider oral sedation and paracervical block/cytotec for office endometrial biopsy.  ~20 minutes spent with patient >50% of time was in face to face discussion of above.

## 2018-10-01 ENCOUNTER — Telehealth: Payer: Self-pay | Admitting: Obstetrics & Gynecology

## 2018-10-01 ENCOUNTER — Encounter: Payer: Self-pay | Admitting: Obstetrics & Gynecology

## 2018-10-01 NOTE — Telephone Encounter (Signed)
Spoke with patient regarding benefit for surgery. Patient understood information presented. Patient aware this is professional benefit only. Patient states she would like to verify and confirm herself, with her insurance company the benefits for the hospital. Patient advises once she speaks with her insurance company, she will call back to advise how she would like to proceed.  Forwarding to Dr Sabra Heck for final review. Patient is agreeable to disposition. Will close encounter  cc: Lamont Snowball, RN

## 2018-10-02 ENCOUNTER — Telehealth: Payer: Self-pay | Admitting: Obstetrics & Gynecology

## 2018-10-02 NOTE — Telephone Encounter (Signed)
Patient is asking to talk with Deloris Ping again. Patient states Deloris Ping gave her a phone number and no one is answering this number.

## 2018-10-03 NOTE — Telephone Encounter (Signed)
See previous phone note. Spoke with patient again in regards to surgery. Patient advises she has had all hospital benefit questions answered and she is ready to proceed with scheduling a hysteroscopy and D&C. Forwarding to Conservation officer, historic buildings for scheduling.   Routing to Lamont Snowball, RN

## 2018-10-04 ENCOUNTER — Other Ambulatory Visit (INDEPENDENT_AMBULATORY_CARE_PROVIDER_SITE_OTHER): Payer: Medicare HMO

## 2018-10-04 DIAGNOSIS — E118 Type 2 diabetes mellitus with unspecified complications: Secondary | ICD-10-CM | POA: Diagnosis not present

## 2018-10-04 DIAGNOSIS — R899 Unspecified abnormal finding in specimens from other organs, systems and tissues: Secondary | ICD-10-CM

## 2018-10-04 DIAGNOSIS — E789 Disorder of lipoprotein metabolism, unspecified: Secondary | ICD-10-CM | POA: Diagnosis not present

## 2018-10-04 NOTE — Telephone Encounter (Signed)
Call back from patient. She and husband would like to proceed on 10-30-18.  Surgery instruction sheet reviewed and printed copy will be mailed.   Routing to Dr Sabra Heck. Encounter closed.

## 2018-10-04 NOTE — Telephone Encounter (Signed)
Call from patient. Reviewed date options. She needs to review options with husband and call back. May wait until January due to holiday plans.

## 2018-10-04 NOTE — Telephone Encounter (Signed)
Call to patient. States she is in a meeting and unable to talk. She will call back.

## 2018-10-05 ENCOUNTER — Other Ambulatory Visit: Payer: Self-pay | Admitting: Obstetrics & Gynecology

## 2018-10-05 LAB — LIPID PANEL
CHOLESTEROL TOTAL: 236 mg/dL — AB (ref 100–199)
Chol/HDL Ratio: 3.1 ratio (ref 0.0–4.4)
HDL: 77 mg/dL (ref >39)
LDL CALC: 145 mg/dL — AB (ref 0–99)
Triglycerides: 70 mg/dL (ref 0–149)
VLDL Cholesterol Cal: 14 mg/dL (ref 5–40)

## 2018-10-05 LAB — HEMOGLOBIN A1C
ESTIMATED AVERAGE GLUCOSE: 128 mg/dL
Hgb A1c MFr Bld: 6.1 % — ABNORMAL HIGH (ref 4.8–5.6)

## 2018-10-08 ENCOUNTER — Telehealth: Payer: Self-pay | Admitting: *Deleted

## 2018-10-08 NOTE — Telephone Encounter (Signed)
-----   Message from Regina Eck, CNM sent at 10/08/2018  8:15 AM EST ----- Notify patient her HGB A1-C is elevated from 6.0 to 6.1 . Lipid panel with improvement of cholesterol from 243 to 236, HDL still good at 77 LDL still elevated at 145 from 146. Feel patient should establish with PCP due to Hgb A1-C results. Please help her with this if needed.  Work on decrease carbohydrates in diet, increase in protein, fish and fiber in diet. Will need recheck of cholesterol at [facility]ex.

## 2018-10-08 NOTE — Telephone Encounter (Signed)
Call to patient. Results reviewed with patient and she verbalized understanding. Patient states she doesn't really have a PCP at this time. RN advised patient of importance of Hgb A1-C follow up. Patient states she would like to discuss with her husband and decide if she wants to see a PCP. States, "I have a lot going on right now." RN advised if our office could be of assistance in scheduling or for PCP recommendations to return call to office. Patient agreeable.   Routing to provider and will close encounter.

## 2018-10-11 ENCOUNTER — Encounter: Payer: Self-pay | Admitting: Obstetrics & Gynecology

## 2018-10-21 DIAGNOSIS — Z1211 Encounter for screening for malignant neoplasm of colon: Secondary | ICD-10-CM | POA: Diagnosis not present

## 2018-10-21 DIAGNOSIS — Z1212 Encounter for screening for malignant neoplasm of rectum: Secondary | ICD-10-CM | POA: Diagnosis not present

## 2018-10-22 ENCOUNTER — Telehealth: Payer: Self-pay | Admitting: Obstetrics & Gynecology

## 2018-10-22 ENCOUNTER — Encounter: Payer: Self-pay | Admitting: Emergency Medicine

## 2018-10-22 NOTE — Telephone Encounter (Signed)
Late entry for 1509: Call to patient.  She has not received her letter for surgical instructions in the mail yet.  Offered my apologies.  She states she is frustrated that I cannot email her her instructions. I explained that it is the policy of [facility] to not email her medical records. For protection of her privacy.  I offered to send the letter to her mychart and that she could have the instructions immediately and patient states "I do not know how to work [employer] that well." She declines for me to give her instructions verbally at this time because she does not have a pen.  She asks that I fax her the instructions and I have done so, with fax confirmation received to her personal fax number [phone].  Encounter closed.

## 2018-10-22 NOTE — Telephone Encounter (Signed)
Patient is calling regarding the surgery information form mailed to her last week. She has not received the form and is asking what she could take for pain? She also asked if it was possible to e-mail the form to her?

## 2018-10-23 ENCOUNTER — Encounter: Payer: Self-pay | Admitting: Obstetrics & Gynecology

## 2018-10-23 ENCOUNTER — Ambulatory Visit (INDEPENDENT_AMBULATORY_CARE_PROVIDER_SITE_OTHER): Payer: Medicare HMO | Admitting: Obstetrics & Gynecology

## 2018-10-23 ENCOUNTER — Encounter (HOSPITAL_BASED_OUTPATIENT_CLINIC_OR_DEPARTMENT_OTHER): Payer: Self-pay | Admitting: *Deleted

## 2018-10-23 ENCOUNTER — Other Ambulatory Visit: Payer: Self-pay

## 2018-10-23 VITALS — BP 128/70 | HR 64 | Resp 16 | Ht 67.25 in | Wt 190.0 lb

## 2018-10-23 DIAGNOSIS — N950 Postmenopausal bleeding: Secondary | ICD-10-CM | POA: Diagnosis not present

## 2018-10-23 DIAGNOSIS — R9389 Abnormal findings on diagnostic imaging of other specified body structures: Secondary | ICD-10-CM

## 2018-10-23 NOTE — Progress Notes (Addendum)
Spoke w/ pt via phone for pre-op interview.  Npo after mn.  Arrive at [facility].  Getting cbc done Thursday 10-25-2018 @ 1100.  Asked pt to bring rescue inhaler.
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
## 2018-10-25 ENCOUNTER — Encounter (HOSPITAL_COMMUNITY)
Admission: RE | Admit: 2018-10-25 | Discharge: 2018-10-25 | Disposition: A | Discharge status: Home / Self Care | Payer: Medicare HMO | Source: Ambulatory Visit | Attending: Obstetrics & Gynecology | Admitting: Obstetrics & Gynecology

## 2018-10-25 ENCOUNTER — Telehealth: Payer: Self-pay | Admitting: Obstetrics & Gynecology

## 2018-10-25 DIAGNOSIS — Z79899 Other long term (current) drug therapy: Secondary | ICD-10-CM | POA: Diagnosis not present

## 2018-10-25 DIAGNOSIS — Z888 Allergy status to other drugs, medicaments and biological substances status: Secondary | ICD-10-CM | POA: Diagnosis not present

## 2018-10-25 DIAGNOSIS — Z01812 Encounter for preprocedural laboratory examination: Secondary | ICD-10-CM | POA: Diagnosis not present

## 2018-10-25 DIAGNOSIS — N840 Polyp of corpus uteri: Secondary | ICD-10-CM | POA: Diagnosis not present

## 2018-10-25 DIAGNOSIS — K589 Irritable bowel syndrome without diarrhea: Secondary | ICD-10-CM | POA: Diagnosis not present

## 2018-10-25 DIAGNOSIS — M109 Gout, unspecified: Secondary | ICD-10-CM | POA: Diagnosis not present

## 2018-10-25 DIAGNOSIS — K219 Gastro-esophageal reflux disease without esophagitis: Secondary | ICD-10-CM | POA: Diagnosis not present

## 2018-10-25 DIAGNOSIS — Z791 Long term (current) use of non-steroidal anti-inflammatories (NSAID): Secondary | ICD-10-CM | POA: Diagnosis not present

## 2018-10-25 DIAGNOSIS — N950 Postmenopausal bleeding: Secondary | ICD-10-CM | POA: Diagnosis not present

## 2018-10-25 LAB — CBC
HCT: 43.3 % (ref 36.0–46.0)
Hemoglobin: 13.4 g/dL (ref 12.0–15.0)
MCH: 27.6 pg (ref 26.0–34.0)
MCHC: 30.9 g/dL (ref 30.0–36.0)
MCV: 89.3 fL (ref 80.0–100.0)
Platelets: 211 10*3/uL (ref 150–400)
RBC: 4.85 MIL/uL (ref 3.87–5.11)
RDW: 14.7 % (ref 11.5–15.5)
WBC: 7 10*3/uL (ref 4.0–10.5)
nRBC: 0 % (ref 0.0–0.2)

## 2018-10-25 NOTE — Telephone Encounter (Signed)
Patient states that she has surgery next week and now has a cold. She said she took Advil cold and sinus. Patient has labs at [facility] AT 11:00 am ans is asking if she needs to reschedule?

## 2018-10-25 NOTE — Telephone Encounter (Signed)
Spoke with patient through incoming call. RN advised patient to keep appointment at [facility] as scheduled. Continue to treat cold as she has been. Advised to follow up with PCP if condition worsened. Patient agreeable.   Routing to provider and will close encounter.

## 2018-10-30 ENCOUNTER — Other Ambulatory Visit: Payer: Self-pay

## 2018-10-30 ENCOUNTER — Ambulatory Visit (HOSPITAL_BASED_OUTPATIENT_CLINIC_OR_DEPARTMENT_OTHER): Payer: Medicare HMO | Admitting: Anesthesiology

## 2018-10-30 ENCOUNTER — Ambulatory Visit (HOSPITAL_BASED_OUTPATIENT_CLINIC_OR_DEPARTMENT_OTHER)
Admission: RE | Admit: 2018-10-30 | Discharge: 2018-10-30 | Disposition: A | Discharge status: Home / Self Care | Payer: Medicare HMO | Source: Ambulatory Visit | Attending: Obstetrics & Gynecology | Admitting: Obstetrics & Gynecology

## 2018-10-30 ENCOUNTER — Encounter (HOSPITAL_BASED_OUTPATIENT_CLINIC_OR_DEPARTMENT_OTHER)
Admission: RE | Disposition: A | Discharge status: Home / Self Care | Payer: Self-pay | Source: Ambulatory Visit | Attending: Obstetrics & Gynecology

## 2018-10-30 ENCOUNTER — Encounter (HOSPITAL_BASED_OUTPATIENT_CLINIC_OR_DEPARTMENT_OTHER): Payer: Self-pay | Admitting: *Deleted

## 2018-10-30 DIAGNOSIS — Z791 Long term (current) use of non-steroidal anti-inflammatories (NSAID): Secondary | ICD-10-CM | POA: Insufficient documentation

## 2018-10-30 DIAGNOSIS — N840 Polyp of corpus uteri: Secondary | ICD-10-CM | POA: Diagnosis not present

## 2018-10-30 DIAGNOSIS — Z888 Allergy status to other drugs, medicaments and biological substances status: Secondary | ICD-10-CM | POA: Diagnosis not present

## 2018-10-30 DIAGNOSIS — K589 Irritable bowel syndrome without diarrhea: Secondary | ICD-10-CM | POA: Insufficient documentation

## 2018-10-30 DIAGNOSIS — K219 Gastro-esophageal reflux disease without esophagitis: Secondary | ICD-10-CM | POA: Insufficient documentation

## 2018-10-30 DIAGNOSIS — N950 Postmenopausal bleeding: Secondary | ICD-10-CM | POA: Diagnosis not present

## 2018-10-30 DIAGNOSIS — M109 Gout, unspecified: Secondary | ICD-10-CM | POA: Diagnosis not present

## 2018-10-30 DIAGNOSIS — Z79899 Other long term (current) drug therapy: Secondary | ICD-10-CM | POA: Diagnosis not present

## 2018-10-30 DIAGNOSIS — R9389 Abnormal findings on diagnostic imaging of other specified body structures: Secondary | ICD-10-CM | POA: Diagnosis not present

## 2018-10-30 DIAGNOSIS — N8500 Endometrial hyperplasia, unspecified: Secondary | ICD-10-CM | POA: Diagnosis not present

## 2018-10-30 HISTORY — DX: Gout, unspecified: M10.9

## 2018-10-30 HISTORY — DX: Personal history of other diseases of the female genital tract: Z87.42

## 2018-10-30 HISTORY — PX: DILATATION & CURETTAGE/HYSTEROSCOPY WITH MYOSURE: SHX6511

## 2018-10-30 HISTORY — DX: Gastro-esophageal reflux disease without esophagitis: K21.9

## 2018-10-30 HISTORY — DX: Frequency of micturition: R35.0

## 2018-10-30 HISTORY — DX: Other allergic rhinitis: J30.89

## 2018-10-30 HISTORY — DX: Unspecified asthma, uncomplicated: J45.909

## 2018-10-30 SURGERY — DILATATION & CURETTAGE/HYSTEROSCOPY WITH MYOSURE
Anesthesia: General | Site: Vagina

## 2018-10-30 MED ORDER — ACETAMINOPHEN 500 MG PO TABS
1000.0000 mg | ORAL_TABLET | Freq: Once | ORAL | Status: AC
  Administered 2018-10-30: 1000 mg via ORAL
  Filled 2018-10-30: qty 2

## 2018-10-30 MED ORDER — DEXAMETHASONE SODIUM PHOSPHATE 10 MG/ML IJ SOLN
INTRAMUSCULAR | Status: DC | PRN
  Administered 2018-10-30: 4 mg via INTRAVENOUS

## 2018-10-30 MED ORDER — FENTANYL CITRATE (PF) 100 MCG/2ML IJ SOLN
INTRAMUSCULAR | Status: AC
  Filled 2018-10-30: qty 2

## 2018-10-30 MED ORDER — HYDROCODONE-ACETAMINOPHEN 5-325 MG PO TABS: 1.0000 | ORAL_TABLET | Freq: Four times a day (QID) | ORAL | 0 refills | Status: AC | PRN

## 2018-10-30 MED ORDER — PROPOFOL 10 MG/ML IV BOLUS
INTRAVENOUS | Status: AC
  Filled 2018-10-30: qty 20

## 2018-10-30 MED ORDER — LIDOCAINE 2% (20 MG/ML) 5 ML SYRINGE
INTRAMUSCULAR | Status: DC | PRN
  Administered 2018-10-30: 100 mg via INTRAVENOUS

## 2018-10-30 MED ORDER — IBUPROFEN 200 MG PO TABS: 800.0000 mg | ORAL_TABLET | Freq: Three times a day (TID) | ORAL | 0 refills | Status: DC | PRN

## 2018-10-30 MED ORDER — FENTANYL CITRATE (PF) 100 MCG/2ML IJ SOLN
25.0000 ug | INTRAMUSCULAR | Status: DC | PRN
  Filled 2018-10-30: qty 1

## 2018-10-30 MED ORDER — ONDANSETRON HCL 4 MG/2ML IJ SOLN
4.0000 mg | Freq: Once | INTRAMUSCULAR | Status: DC | PRN
  Filled 2018-10-30: qty 2

## 2018-10-30 MED ORDER — DEXAMETHASONE SODIUM PHOSPHATE 10 MG/ML IJ SOLN
INTRAMUSCULAR | Status: AC
  Filled 2018-10-30: qty 1

## 2018-10-30 MED ORDER — KETOROLAC TROMETHAMINE 30 MG/ML IJ SOLN
INTRAMUSCULAR | Status: AC
  Filled 2018-10-30: qty 1

## 2018-10-30 MED ORDER — LACTATED RINGERS IV SOLN
INTRAVENOUS | Status: DC
  Administered 2018-10-30: 09:00:00 via INTRAVENOUS
  Filled 2018-10-30: qty 1000

## 2018-10-30 MED ORDER — PROPOFOL 10 MG/ML IV BOLUS
INTRAVENOUS | Status: DC | PRN
  Administered 2018-10-30: 150 mg via INTRAVENOUS

## 2018-10-30 MED ORDER — ONDANSETRON HCL 4 MG/2ML IJ SOLN
INTRAMUSCULAR | Status: AC
  Filled 2018-10-30: qty 2

## 2018-10-30 MED ORDER — KETOROLAC TROMETHAMINE 30 MG/ML IJ SOLN
INTRAMUSCULAR | Status: DC | PRN
  Administered 2018-10-30: 30 mg via INTRAVENOUS

## 2018-10-30 MED ORDER — FENTANYL CITRATE (PF) 100 MCG/2ML IJ SOLN
INTRAMUSCULAR | Status: DC | PRN
  Administered 2018-10-30: 100 ug via INTRAVENOUS

## 2018-10-30 MED ORDER — ACETAMINOPHEN 500 MG PO TABS
ORAL_TABLET | ORAL | Status: AC
  Filled 2018-10-30: qty 2

## 2018-10-30 MED ORDER — LIDOCAINE-EPINEPHRINE 1 %-1:100000 IJ SOLN
INTRAMUSCULAR | Status: DC | PRN
  Administered 2018-10-30: 10 mL

## 2018-10-30 MED ORDER — MIDAZOLAM HCL 2 MG/2ML IJ SOLN
INTRAMUSCULAR | Status: DC | PRN
  Administered 2018-10-30: 2 mg via INTRAVENOUS

## 2018-10-30 MED ORDER — LIDOCAINE 2% (20 MG/ML) 5 ML SYRINGE
INTRAMUSCULAR | Status: AC
  Filled 2018-10-30: qty 5

## 2018-10-30 MED ORDER — ONDANSETRON HCL 4 MG/2ML IJ SOLN
INTRAMUSCULAR | Status: DC | PRN
  Administered 2018-10-30: 4 mg via INTRAVENOUS

## 2018-10-30 MED ORDER — SODIUM CHLORIDE 0.9 % IR SOLN
Status: DC | PRN
  Administered 2018-10-30: 3000 mL

## 2018-10-30 MED ORDER — MIDAZOLAM HCL 2 MG/2ML IJ SOLN
INTRAMUSCULAR | Status: AC
  Filled 2018-10-30: qty 2

## 2018-10-30 SURGICAL SUPPLY — 29 items
BIPOLAR CUTTING LOOP 21FR (ELECTRODE)
CANISTER SUCT 3000ML PPV (MISCELLANEOUS) ×6 IMPLANT
CATH ROBINSON RED A/P 16FR (CATHETERS) ×3 IMPLANT
COVER WAND RF STERILE (DRAPES) ×3 IMPLANT
DEVICE MYOSURE LITE (MISCELLANEOUS) IMPLANT
DEVICE MYOSURE REACH (MISCELLANEOUS) IMPLANT
DILATOR CANAL MILEX (MISCELLANEOUS) IMPLANT
GAUZE 4X4 16PLY RFD (DISPOSABLE) ×3 IMPLANT
GLOVE BIOGEL PI IND STRL 7.0 (GLOVE) ×1 IMPLANT
GLOVE BIOGEL PI IND STRL 7.5 (GLOVE) ×1 IMPLANT
GLOVE BIOGEL PI IND STRL 8.5 (GLOVE) ×1 IMPLANT
GLOVE BIOGEL PI INDICATOR 7.0 (GLOVE) ×2
GLOVE BIOGEL PI INDICATOR 7.5 (GLOVE) ×2
GLOVE BIOGEL PI INDICATOR 8.5 (GLOVE) ×2
GLOVE ECLIPSE 6.5 STRL STRAW (GLOVE) ×6 IMPLANT
GLOVE INDICATOR 8.5 STRL (GLOVE) ×3 IMPLANT
GOWN STRL REUS W/ TWL LRG LVL3 (GOWN DISPOSABLE) ×1 IMPLANT
GOWN STRL REUS W/ TWL XL LVL3 (GOWN DISPOSABLE) ×1 IMPLANT
GOWN STRL REUS W/TWL LRG LVL3 (GOWN DISPOSABLE) ×2
GOWN STRL REUS W/TWL XL LVL3 (GOWN DISPOSABLE) ×2
IV NS IRRIG 3000ML ARTHROMATIC (IV SOLUTION) ×6 IMPLANT
KIT PROCEDURE FLUENT (KITS) ×3 IMPLANT
KIT TURNOVER CYSTO (KITS) ×3 IMPLANT
LOOP CUTTING BIPOLAR 21FR (ELECTRODE) IMPLANT
PACK VAGINAL MINOR WOMEN LF (CUSTOM PROCEDURE TRAY) ×3 IMPLANT
PAD OB MATERNITY 4.3X12.25 (PERSONAL CARE ITEMS) ×3 IMPLANT
SEAL ROD LENS SCOPE MYOSURE (ABLATOR) ×3 IMPLANT
TOWEL OR 17X24 6PK STRL BLUE (TOWEL DISPOSABLE) ×6 IMPLANT
WATER STERILE IRR 500ML POUR (IV SOLUTION) ×3 IMPLANT

## 2018-10-30 NOTE — Transfer of Care (Signed)
Immediate Anesthesia Transfer of Care Note  Patient: Kristen Anderson  Procedure(s) Performed: DILATATION & CURETTAGE/HYSTEROSCOPY (N/A Vagina )  Patient Location: PACU  Anesthesia Type:General  Level of Consciousness: awake, alert  and oriented  Airway & Oxygen Therapy: Patient Spontanous Breathing and Patient connected to nasal cannula oxygen  Post-op Assessment: Report given to RN and Post -op Vital signs reviewed and stable  Post vital signs: Reviewed and stable  Last Vitals:  Vitals Value Taken Time  BP    Temp    Pulse    Resp    SpO2      Last Pain:  Vitals:   10/30/18 0831  TempSrc:   PainSc: 0-No pain      Patients Stated Pain Goal: 6 (21/62/44 6950)  Complications: No apparent anesthesia complications

## 2018-10-30 NOTE — Discharge Instructions (Signed)

## 2018-10-30 NOTE — H&P (Signed)
Kristen Anderson is an 67 y.o. female G2P2 MWF with hx of PMP bleeding in November.  PUS showed endometrium thickened at 6.82mm.  Endometrial biopsy was recommended and she declined this in the office due to prior experience with now-retired physician.  She desired procedure to be done in the OR.  She is here for this today.  Hysteroscopy with resection of any endometrial mass has been discussed as well as D&C.  Risks, benefits and alternatives have been di cussed.  She is here and ready to proceed.  Pertinent Gynecological History: Menses: post-menopausal Bleeding: post menopausal bleeding Contraception: post menopausal status DES exposure: denies Blood transfusions: none Sexually transmitted diseases: no past history Previous GYN Procedures: none  Last mammogram: normal Date: 01/25/18 Last pap: normal Date: 04/26/18 OB History: G2, P2   Menstrual History: Patient's last menstrual period was 10/21/2006 (approximate).    Past Medical History:  Diagnosis Date  . Environmental and seasonal allergies   . Frequency of urination   . GERD (gastroesophageal reflux disease)    watches diet  . Gout, arthritis   . History of abnormal cervical Pap smear   . History of ultrasound, pelvic 05/03/2006   Menometrorrhagia - Dr. Ammie Ferrier  . IBS (irritable bowel syndrome)   . Menopausal state    On HRT since 01/18/2006  . Mild asthma   . PMB (postmenopausal bleeding)   . SUI (stress urinary incontinence, female)     Past Surgical History:  Procedure Laterality Date  . BREAST BIOPSY  x2  1987  . COLONOSCOPY  01/2009   also had BA enema  . HYSTEROSCOPY W/D&C  07/20/2009    dr romine @[facility]    resection endocervical polyp (benign)  . TONSILLECTOMY AND ADENOIDECTOMY  child    Family History  Problem Relation Age of Onset  . Cancer - Cervical Mother 69       Had Hysteretomy; doesn't know details  . Cancer - Ovarian Sister        1/2 sister  . Lupus Sister     Social History:  reports that she has  never smoked. She has never used smokeless tobacco. She reports that she does not drink alcohol or use drugs.  Allergies:  Allergies  Allergen Reactions  . Adhesive [Tape] Rash    Medications Prior to Admission  Medication Sig Dispense Refill Last Dose  . calcium carbonate (TUMS - DOSED IN MG ELEMENTAL CALCIUM) 500 MG chewable tablet Chew 1 tablet by mouth as needed for indigestion or heartburn.   Past Week at Unknown time  . cetirizine (ZYRTEC) 10 MG tablet Take 5 mg by mouth as needed.    Not Taking  . fluticasone (FLONASE) 50 MCG/ACT nasal spray Place 1 spray into both nostrils daily as needed.    Past Month at Unknown time  . hydrocortisone (ANUSOL-HC) 2.5 % rectal cream Place 1 application rectally 2 (two) times daily. PRN hemorrhoid flare. (Patient taking differently: Place 1 application rectally 2 (two) times daily as needed. PRN hemorrhoid flare.) 28.35 g 0 Past Week at Unknown time  . montelukast (SINGULAIR) 10 MG tablet Take 1 tablet by mouth at bedtime.   Past Month at Unknown time  . ALBUTEROL IN Inhale into the lungs as needed.   More than a month at Unknown time  . b complex vitamins tablet Take 1 tablet by mouth daily.   More than a month at Unknown time  . Cholecalciferol (VITAMIN D PO) Take by mouth.   More than a  month at Unknown time  . ibuprofen (ADVIL,MOTRIN) 200 MG tablet Take 200 mg by mouth every 6 (six) hours as needed.   More than a month at Unknown time  . MAGNESIUM PO Take by mouth.   More than a month at Unknown time  . MITIGARE 0.6 MG CAPS Take 1 capsule by mouth daily as needed.   5 More than a month at Unknown time  . Multiple Vitamin (MULTIVITAMIN) tablet Take 1 tablet by mouth daily.   More than a month at Unknown time  . TURMERIC PO Take by mouth.   More than a month at Unknown time  . VITAMIN E PO Take by mouth.   More than a month at Unknown time    Review of Systems  All other systems reviewed and are negative.   Blood pressure 132/76, pulse 63,  temperature 98.5 F (36.9 C), temperature source Oral, resp. rate 16, height 5' 7.25" (1.708 m), weight 85.4 kg, last menstrual period 10/21/2006, SpO2 95 %. Physical Exam  Constitutional: She is oriented to person, place, and time. She appears well-developed and well-nourished.  Cardiovascular: Normal rate and regular rhythm.  Respiratory: Effort normal and breath sounds normal.  Neurological: She is alert and oriented to person, place, and time.  Skin: Skin is warm and dry.  Psychiatric: She has a normal mood and affect.    No results found for this or any previous visit (from the past 24 hour(s)).  No results found.  Assessment/Plan: 67 yo G2P2 MWF with PMP bleeding here for additional evaluation with hysteroscopy, possible polyp resection, D&C.  She desired this to be done in the OR and no biopsy or procedures in the office.  Megan Salon 10/30/2018, 11:17 AM

## 2018-10-30 NOTE — Anesthesia Preprocedure Evaluation (Addendum)
Anesthesia Evaluation  Patient identified by MRN, date of birth, ID band Patient awake    Reviewed: Allergy & Precautions, NPO status , Patient's Chart, lab work & pertinent test results  Airway Mallampati: II  TM Distance: >3 FB Neck ROM: Full    Dental  (+) Teeth Intact, Dental Advisory Given, Caps,    Pulmonary asthma ,    Pulmonary exam normal breath sounds clear to auscultation       Cardiovascular negative cardio ROS Normal cardiovascular exam Rhythm:Regular Rate:Normal     Neuro/Psych negative neurological ROS     GI/Hepatic Neg liver ROS, GERD  Controlled,  Endo/Other  negative endocrine ROS  Renal/GU negative Renal ROS   SUI    Musculoskeletal  (+) Arthritis ,   Abdominal   Peds  Hematology negative hematology ROS (+)   Anesthesia Other Findings Day of surgery medications reviewed with the patient.  Reproductive/Obstetrics PMB, thickened endometrium                            Anesthesia Physical Anesthesia Plan  ASA: II  Anesthesia Plan: General   Post-op Pain Management:    Induction: Intravenous  PONV Risk Score and Plan: 4 or greater and Dexamethasone, Ondansetron and Midazolam  Airway Management Planned: LMA  Additional Equipment:   Intra-op Plan:   Post-operative Plan: Extubation in OR  Informed Consent: I have reviewed the patients History and Physical, chart, labs and discussed the procedure including the risks, benefits and alternatives for the proposed anesthesia with the patient or authorized representative who has indicated his/her understanding and acceptance.   Dental advisory given  Plan Discussed with: CRNA  Anesthesia Plan Comments:         Anesthesia Quick Evaluation

## 2018-10-30 NOTE — Anesthesia Procedure Notes (Signed)
Procedure Name: LMA Insertion Date/Time: 10/30/2018 11:29 AM Performed by: Jonna Munro, CRNA Pre-anesthesia Checklist: Patient identified, Emergency Drugs available, Suction available, Patient being monitored and Timeout performed Patient Re-evaluated:Patient Re-evaluated prior to induction Oxygen Delivery Method: Circle system utilized Preoxygenation: Pre-oxygenation with 100% oxygen Induction Type: IV induction LMA: LMA inserted LMA Size: 4.0 Number of attempts: 1 Placement Confirmation: positive ETCO2 and breath sounds checked- equal and bilateral Dental Injury: Teeth and Oropharynx as per pre-operative assessment

## 2018-10-30 NOTE — Op Note (Signed)
10/30/2018  12:35 PM  PATIENT:  Kristen Anderson  67 y.o. female  PRE-OPERATIVE DIAGNOSIS:  PMB, thickened endometrium  POST-OPERATIVE DIAGNOSIS:  PMB, thickened endometrium  PROCEDURE:  Procedure(s): DILATATION & CURETTAGE/HYSTEROSCOPY  SURGEON:  Megan Salon  ASSISTANTS: OR staff   ANESTHESIA:   general  ESTIMATED BLOOD LOSS: 2 mL  BLOOD ADMINISTERED:none   FLUIDS: 700CC LR  UOP: voided before going back to OR  SPECIMEN:  Endometrial curettings  DISPOSITION OF SPECIMEN:  PATHOLOGY  FINDINGS: normal appearing endometrial cavity  DESCRIPTION OF OPERATION: Patient was taken to the operating room.  She is placed in the supine position. SCDs were on her lower extremities and functioning properly. General anesthesia with an LMA was administered without difficulty.   Legs were then placed in the [place] in the low lithotomy position. The legs were lifted to the high lithotomy position and the Betadine prep was used on the inner thighs perineum and vagina x3. Patient was draped in a normal standard fashion.  A bivalve speculum was placed the vagina. The anterior lip of the cervix was grasped with single-tooth tenaculum.  A paracervical block of 1% lidocaine mixed one-to-one with epinephrine (1:100,000 units).  10 cc was used total. The cervix is dilated up to #21 [facility] dilators. The endometrial cavity sounded to 7 cm.   A 2.9 millimeter diagnostic hysteroscope was obtained. Normal saline was used as a hysteroscopic fluid. The hysteroscope was advanced through the endocervical canal into the endometrial cavity. The tubal ostia were noted bilaterally. Additional findings included thin appearing endometrium.  The hysteroscope was removed. A #1 toothed curette was used to curette the cavity until rough gritty texture was noted in all quadrants. With revisualization of the hysteroscope, there was no evidence of perforation or other complication.  At this point no other procedure was  needed and this procedure was ended. The hysteroscope was removed. The fluid deficit was 100 cc. The tenaculum was removed from the anterior lip of the cervix. The speculum was removed from the vagina. The prep was cleansed of the patient's skin. The legs are positioned back in the supine position. Sponge, lap, needle, initially counts were correct x2. Patient was taken to recovery in stable condition.  COUNTS:  YES  PLAN OF CARE: Transfer to PACU

## 2018-10-30 NOTE — Anesthesia Postprocedure Evaluation (Signed)
Anesthesia Post Note  Patient: Kristen Anderson  Procedure(s) Performed: DILATATION & CURETTAGE/HYSTEROSCOPY (N/A Vagina )     Patient location during evaluation: PACU Anesthesia Type: General Level of consciousness: awake and alert, awake and oriented Pain management: pain level controlled Vital Signs Assessment: post-procedure vital signs reviewed and stable Respiratory status: spontaneous breathing, nonlabored ventilation and respiratory function stable Cardiovascular status: blood pressure returned to baseline and stable Postop Assessment: no apparent nausea or vomiting Anesthetic complications: no    Last Vitals:  Vitals:   10/30/18 1245 10/30/18 1327  BP: 126/67 126/68  Pulse: (!) 59 (!) 54  Resp: 11 16  Temp:  36.6 C  SpO2: 97% 96%    Last Pain:  Vitals:   10/30/18 1327  TempSrc:   PainSc: 0-No pain                 Catalina Gravel

## 2018-10-31 ENCOUNTER — Encounter (HOSPITAL_BASED_OUTPATIENT_CLINIC_OR_DEPARTMENT_OTHER): Payer: Self-pay | Admitting: Obstetrics & Gynecology

## 2018-11-01 ENCOUNTER — Telehealth: Payer: Self-pay | Admitting: *Deleted

## 2018-11-01 ENCOUNTER — Other Ambulatory Visit: Payer: Self-pay | Admitting: *Deleted

## 2018-11-01 DIAGNOSIS — Z1212 Encounter for screening for malignant neoplasm of rectum: Secondary | ICD-10-CM

## 2018-11-01 DIAGNOSIS — Z1211 Encounter for screening for malignant neoplasm of colon: Secondary | ICD-10-CM

## 2018-11-01 LAB — COLOGUARD: COLOGUARD: NEGATIVE

## 2018-11-01 NOTE — Telephone Encounter (Signed)
Patient notified of negative Cologuard results and verbalized understanding.   Encounter closed.

## 2018-11-06 ENCOUNTER — Encounter: Payer: Self-pay | Admitting: Obstetrics & Gynecology

## 2018-11-12 ENCOUNTER — Ambulatory Visit: Payer: Medicare HMO | Admitting: Obstetrics & Gynecology

## 2018-11-22 ENCOUNTER — Encounter: Payer: Self-pay | Admitting: Obstetrics & Gynecology

## 2018-11-22 ENCOUNTER — Ambulatory Visit (INDEPENDENT_AMBULATORY_CARE_PROVIDER_SITE_OTHER): Payer: Medicare HMO | Admitting: Obstetrics & Gynecology

## 2018-11-22 VITALS — BP 118/64 | HR 70 | Resp 14 | Ht 67.25 in | Wt 190.2 lb

## 2018-11-22 DIAGNOSIS — N952 Postmenopausal atrophic vaginitis: Secondary | ICD-10-CM

## 2018-11-22 DIAGNOSIS — R9389 Abnormal findings on diagnostic imaging of other specified body structures: Secondary | ICD-10-CM

## 2018-11-22 DIAGNOSIS — N840 Polyp of corpus uteri: Secondary | ICD-10-CM

## 2018-11-22 MED ORDER — NONFORMULARY OR COMPOUNDED ITEM: 1 refills | Status: DC

## 2018-11-22 NOTE — Progress Notes (Signed)
GYNECOLOGY  VISIT  CC:   Follow up after hysteroscopy  HPI: 68 y.o. G4P2202 Married White or Caucasian female here for follow up after having hysteroscopy with polyp resection and D&C.  Reports she had a lot of fatigue post-op.  Reports she's feeling better but has experienced a URI around and since the time of surgery.  Had spotting for a few days post-op and took tylenol only for post op cramping pain.    Pictures and pathology reviewed with pt.    Has experienced significant issues with painful intercourse.  Would like to address this.  Has used KY jelly but that didn't help very much.  Has used another vaginal product but she cannot recall which one.  Reports interest is low due to pain.  Absolutely not interested in any estrogen products so I may not be able to make a lot of improvement with treatment but can at least try some non-hormonal options.  Vit E vaginal suppositories discussed including dosing, route of administration, frequently, length of time needed for improvement.  She would like to try this.  GYNECOLOGIC HISTORY: Patient's last menstrual period was 10/21/2006 (approximate). Contraception: PMP Menopausal hormone therapy: none  Patient Active Problem List   Diagnosis Date Noted  . Routine gynecological examination 02/05/2013    Past Medical History:  Diagnosis Date  . Environmental and seasonal allergies   . Frequency of urination   . GERD (gastroesophageal reflux disease)    watches diet  . Gout, arthritis   . History of abnormal cervical Pap smear   . History of ultrasound, pelvic 05/03/2006   Menometrorrhagia - Dr. Ammie Ferrier  . IBS (irritable bowel syndrome)   . Menopausal state    On HRT since 01/18/2006  . Mild asthma   . PMB (postmenopausal bleeding)   . SUI (stress urinary incontinence, female)     Past Surgical History:  Procedure Laterality Date  . BREAST BIOPSY  x2  1987  . COLONOSCOPY  01/2009   also had BA enema  . DILATATION &  CURETTAGE/HYSTEROSCOPY WITH MYOSURE N/A 10/30/2018   Procedure: DILATATION & CURETTAGE/HYSTEROSCOPY;  Surgeon: Megan Salon, MD;  [facility]: [facility];  Service: Gynecology;  Laterality: N/A;  . HYSTEROSCOPY W/D&C  07/20/2009    dr Joan Flores @[facility]    resection endocervical polyp (benign)  . TONSILLECTOMY AND ADENOIDECTOMY  child    MEDS:   Current Outpatient Medications on File Prior to Visit  Medication Sig Dispense Refill  . ALBUTEROL IN Inhale into the lungs as needed.    Marland Kitchen b complex vitamins tablet Take 1 tablet by mouth daily.    . calcium carbonate (TUMS - DOSED IN MG ELEMENTAL CALCIUM) 500 MG chewable tablet Chew 1 tablet by mouth as needed for indigestion or heartburn.    . cetirizine (ZYRTEC) 10 MG tablet Take 5 mg by mouth as needed.     . Cholecalciferol (VITAMIN D PO) Take by mouth.    . fluticasone (FLONASE) 50 MCG/ACT nasal spray Place 1 spray into both nostrils daily as needed.     . hydrocortisone (ANUSOL-HC) 2.5 % rectal cream Place 1 application rectally 2 (two) times daily. PRN hemorrhoid flare. (Patient taking differently: Place 1 application rectally 2 (two) times daily as needed. PRN hemorrhoid flare.) 28.35 g 0  . ibuprofen (ADVIL,MOTRIN) 200 MG tablet Take 4 tablets (800 mg total) by mouth every 8 (eight) hours as needed for mild pain, moderate pain or cramping. 30 tablet 0  . MAGNESIUM PO Take  by mouth.    Marland Kitchen MITIGARE 0.6 MG CAPS Take 1 capsule by mouth daily as needed.   5  . montelukast (SINGULAIR) 10 MG tablet Take 1 tablet by mouth at bedtime.    . Multiple Vitamin (MULTIVITAMIN) tablet Take 1 tablet by mouth daily.    . TURMERIC PO Take by mouth.    Marland Kitchen VITAMIN E PO Take by mouth.     No current facility-administered medications on file prior to visit.     ALLERGIES: Adhesive [tape]  Family History  Problem Relation Age of Onset  . Cancer - Cervical Mother 62       Had Hysteretomy; doesn't know details  . Cancer - Ovarian Sister        1/2  sister  . Lupus Sister     SH:  Married, non smoker  Review of Systems  All other systems reviewed and are negative.   PHYSICAL EXAMINATION:    LMP 10/21/2006 (Approximate)     General appearance: alert, cooperative and appears stated age No other exam performed  Chaperone was present for exam.  Assessment: PMP bleeding s/p hysteroscopy with polyp resection D&C Atrophic vaginal changes with dyspareunia  Plan: Vit E vaginal suppositories 200u/ml, one pv three times weekly to pharmacy.  Pt will give update in 3 months.     ~15 minutes spent with patient >50% of time was in face to face discussion of above.

## 2018-11-23 ENCOUNTER — Encounter: Payer: Self-pay | Admitting: Obstetrics & Gynecology

## 2018-11-23 DIAGNOSIS — N952 Postmenopausal atrophic vaginitis: Secondary | ICD-10-CM | POA: Insufficient documentation

## 2018-12-18 ENCOUNTER — Encounter: Payer: Self-pay | Admitting: Obstetrics & Gynecology

## 2018-12-18 ENCOUNTER — Telehealth: Payer: Self-pay | Admitting: Obstetrics & Gynecology

## 2018-12-18 NOTE — Telephone Encounter (Signed)
Patient sent the following correspondence through [facility]. Routing to triage to assist patient with request.  Dr. Sabra Heck,  I started using the vitamin E vaginally. I have been using it for over two weeks. I think it is strange that I have discomfort now. I sometimes have sharp pains and other discomfort in uterus. I do not know if they are related. I also have some spotting the day after I have used the suppository. I am not sure if this is normal or not, but I thought I should bring it to your attention.  Thank you.

## 2018-12-18 NOTE — Telephone Encounter (Signed)
Spoke with patient. Patient is s/p D&C for PMB and thickened endometrium on 10/30/18. Started vit E vaginal suppositories 3 times per wk, is on her 3rd wk of use. Patient reports intermittent cramping and burning just inside vaginal opening since starting suppository. Reports brown vaginal d/c initially now spotting when wiping. Denies fever/chills, urinary symptoms, N/V or vaginal odor. Used last suppository last night, 1/27. Advised patient to stop Vit E suppository, will review with provider and return call with recommendations. Patient verbalizes understanding and is agreeable.   Routing to Dr. Sabra Heck to review and advise.

## 2018-12-19 NOTE — Telephone Encounter (Signed)
Spoke with patient, advised as seen below per Dr. Sabra Heck. Patient does not want to try vaginal estrogen. Patient will call with update in 1 wk, is aware to return call to office if symptoms worsen or new symptoms develop. Patient has stopped Vit E vaginal suppositories.   Routing to provider for final review. Patient is agreeable to disposition. Will close encounter.

## 2018-12-19 NOTE — Telephone Encounter (Signed)
She's likely having a reaction to something in the suppository.  I'd like her to watch for the next week.  If symptoms resolve, great.  If they worsen, then she needs to let me know and I will sent in rx for hydrocortisone suppositories.  Once symptoms resolve, does she want to try an estrogen product vaginally.  We talked about this at her last visit but she really wanted to try a non estrogen option first.  Thanks.

## 2019-01-29 ENCOUNTER — Encounter: Payer: Self-pay | Admitting: Obstetrics & Gynecology

## 2019-01-29 DIAGNOSIS — E2839 Other primary ovarian failure: Secondary | ICD-10-CM | POA: Diagnosis not present

## 2019-01-29 DIAGNOSIS — Z8262 Family history of osteoporosis: Secondary | ICD-10-CM | POA: Diagnosis not present

## 2019-01-29 DIAGNOSIS — Z1231 Encounter for screening mammogram for malignant neoplasm of breast: Secondary | ICD-10-CM | POA: Diagnosis not present

## 2019-01-29 DIAGNOSIS — Z780 Asymptomatic menopausal state: Secondary | ICD-10-CM | POA: Diagnosis not present

## 2019-01-29 DIAGNOSIS — M25551 Pain in right hip: Secondary | ICD-10-CM | POA: Diagnosis not present

## 2019-02-01 DIAGNOSIS — M62830 Muscle spasm of back: Secondary | ICD-10-CM | POA: Diagnosis not present

## 2019-02-01 DIAGNOSIS — M50322 Other cervical disc degeneration at C5-C6 level: Secondary | ICD-10-CM | POA: Diagnosis not present

## 2019-02-01 DIAGNOSIS — M9903 Segmental and somatic dysfunction of lumbar region: Secondary | ICD-10-CM | POA: Diagnosis not present

## 2019-02-01 DIAGNOSIS — M5413 Radiculopathy, cervicothoracic region: Secondary | ICD-10-CM | POA: Diagnosis not present

## 2019-02-01 DIAGNOSIS — M545 Low back pain: Secondary | ICD-10-CM | POA: Diagnosis not present

## 2019-02-01 DIAGNOSIS — M5003 Cervical disc disorder with myelopathy, cervicothoracic region: Secondary | ICD-10-CM | POA: Diagnosis not present

## 2019-02-01 DIAGNOSIS — M9901 Segmental and somatic dysfunction of cervical region: Secondary | ICD-10-CM | POA: Diagnosis not present

## 2019-02-04 DIAGNOSIS — M50322 Other cervical disc degeneration at C5-C6 level: Secondary | ICD-10-CM | POA: Diagnosis not present

## 2019-02-04 DIAGNOSIS — M545 Low back pain: Secondary | ICD-10-CM | POA: Diagnosis not present

## 2019-02-04 DIAGNOSIS — M5413 Radiculopathy, cervicothoracic region: Secondary | ICD-10-CM | POA: Diagnosis not present

## 2019-02-04 DIAGNOSIS — M9903 Segmental and somatic dysfunction of lumbar region: Secondary | ICD-10-CM | POA: Diagnosis not present

## 2019-02-04 DIAGNOSIS — M5003 Cervical disc disorder with myelopathy, cervicothoracic region: Secondary | ICD-10-CM | POA: Diagnosis not present

## 2019-02-04 DIAGNOSIS — M9901 Segmental and somatic dysfunction of cervical region: Secondary | ICD-10-CM | POA: Diagnosis not present

## 2019-02-04 DIAGNOSIS — M62830 Muscle spasm of back: Secondary | ICD-10-CM | POA: Diagnosis not present

## 2019-02-07 DIAGNOSIS — M9903 Segmental and somatic dysfunction of lumbar region: Secondary | ICD-10-CM | POA: Diagnosis not present

## 2019-02-07 DIAGNOSIS — M5413 Radiculopathy, cervicothoracic region: Secondary | ICD-10-CM | POA: Diagnosis not present

## 2019-02-07 DIAGNOSIS — M5003 Cervical disc disorder with myelopathy, cervicothoracic region: Secondary | ICD-10-CM | POA: Diagnosis not present

## 2019-02-07 DIAGNOSIS — M545 Low back pain: Secondary | ICD-10-CM | POA: Diagnosis not present

## 2019-02-07 DIAGNOSIS — M62830 Muscle spasm of back: Secondary | ICD-10-CM | POA: Diagnosis not present

## 2019-02-07 DIAGNOSIS — M9901 Segmental and somatic dysfunction of cervical region: Secondary | ICD-10-CM | POA: Diagnosis not present

## 2019-02-07 DIAGNOSIS — M50322 Other cervical disc degeneration at C5-C6 level: Secondary | ICD-10-CM | POA: Diagnosis not present

## 2019-02-11 DIAGNOSIS — M545 Low back pain: Secondary | ICD-10-CM | POA: Diagnosis not present

## 2019-02-11 DIAGNOSIS — M5413 Radiculopathy, cervicothoracic region: Secondary | ICD-10-CM | POA: Diagnosis not present

## 2019-02-11 DIAGNOSIS — M9903 Segmental and somatic dysfunction of lumbar region: Secondary | ICD-10-CM | POA: Diagnosis not present

## 2019-02-11 DIAGNOSIS — M62830 Muscle spasm of back: Secondary | ICD-10-CM | POA: Diagnosis not present

## 2019-02-11 DIAGNOSIS — M9901 Segmental and somatic dysfunction of cervical region: Secondary | ICD-10-CM | POA: Diagnosis not present

## 2019-02-11 DIAGNOSIS — M5003 Cervical disc disorder with myelopathy, cervicothoracic region: Secondary | ICD-10-CM | POA: Diagnosis not present

## 2019-02-11 DIAGNOSIS — M50322 Other cervical disc degeneration at C5-C6 level: Secondary | ICD-10-CM | POA: Diagnosis not present

## 2019-02-13 ENCOUNTER — Telehealth: Payer: Self-pay | Admitting: *Deleted

## 2019-02-13 NOTE — Telephone Encounter (Signed)
Called patient. Informed of BMD results. Report to scan.  Encounter closed.

## 2019-02-14 DIAGNOSIS — M545 Low back pain: Secondary | ICD-10-CM | POA: Diagnosis not present

## 2019-02-14 DIAGNOSIS — M9903 Segmental and somatic dysfunction of lumbar region: Secondary | ICD-10-CM | POA: Diagnosis not present

## 2019-02-14 DIAGNOSIS — M5003 Cervical disc disorder with myelopathy, cervicothoracic region: Secondary | ICD-10-CM | POA: Diagnosis not present

## 2019-02-14 DIAGNOSIS — M5413 Radiculopathy, cervicothoracic region: Secondary | ICD-10-CM | POA: Diagnosis not present

## 2019-02-14 DIAGNOSIS — M50322 Other cervical disc degeneration at C5-C6 level: Secondary | ICD-10-CM | POA: Diagnosis not present

## 2019-02-14 DIAGNOSIS — M62830 Muscle spasm of back: Secondary | ICD-10-CM | POA: Diagnosis not present

## 2019-02-14 DIAGNOSIS — M9901 Segmental and somatic dysfunction of cervical region: Secondary | ICD-10-CM | POA: Diagnosis not present

## 2019-02-20 DIAGNOSIS — M50322 Other cervical disc degeneration at C5-C6 level: Secondary | ICD-10-CM | POA: Diagnosis not present

## 2019-02-20 DIAGNOSIS — M9901 Segmental and somatic dysfunction of cervical region: Secondary | ICD-10-CM | POA: Diagnosis not present

## 2019-02-20 DIAGNOSIS — M545 Low back pain: Secondary | ICD-10-CM | POA: Diagnosis not present

## 2019-02-20 DIAGNOSIS — M5413 Radiculopathy, cervicothoracic region: Secondary | ICD-10-CM | POA: Diagnosis not present

## 2019-02-20 DIAGNOSIS — M5003 Cervical disc disorder with myelopathy, cervicothoracic region: Secondary | ICD-10-CM | POA: Diagnosis not present

## 2019-02-20 DIAGNOSIS — M9903 Segmental and somatic dysfunction of lumbar region: Secondary | ICD-10-CM | POA: Diagnosis not present

## 2019-02-20 DIAGNOSIS — M62830 Muscle spasm of back: Secondary | ICD-10-CM | POA: Diagnosis not present

## 2019-02-27 ENCOUNTER — Encounter: Payer: Self-pay | Admitting: Obstetrics & Gynecology

## 2019-03-22 DIAGNOSIS — M9903 Segmental and somatic dysfunction of lumbar region: Secondary | ICD-10-CM | POA: Diagnosis not present

## 2019-03-22 DIAGNOSIS — M5413 Radiculopathy, cervicothoracic region: Secondary | ICD-10-CM | POA: Diagnosis not present

## 2019-03-22 DIAGNOSIS — M9901 Segmental and somatic dysfunction of cervical region: Secondary | ICD-10-CM | POA: Diagnosis not present

## 2019-03-22 DIAGNOSIS — M50322 Other cervical disc degeneration at C5-C6 level: Secondary | ICD-10-CM | POA: Diagnosis not present

## 2019-03-22 DIAGNOSIS — M545 Low back pain: Secondary | ICD-10-CM | POA: Diagnosis not present

## 2019-03-22 DIAGNOSIS — M5003 Cervical disc disorder with myelopathy, cervicothoracic region: Secondary | ICD-10-CM | POA: Diagnosis not present

## 2019-03-22 DIAGNOSIS — M62830 Muscle spasm of back: Secondary | ICD-10-CM | POA: Diagnosis not present

## 2019-03-25 DIAGNOSIS — M50322 Other cervical disc degeneration at C5-C6 level: Secondary | ICD-10-CM | POA: Diagnosis not present

## 2019-03-25 DIAGNOSIS — M9903 Segmental and somatic dysfunction of lumbar region: Secondary | ICD-10-CM | POA: Diagnosis not present

## 2019-03-25 DIAGNOSIS — M9901 Segmental and somatic dysfunction of cervical region: Secondary | ICD-10-CM | POA: Diagnosis not present

## 2019-03-25 DIAGNOSIS — M5003 Cervical disc disorder with myelopathy, cervicothoracic region: Secondary | ICD-10-CM | POA: Diagnosis not present

## 2019-03-25 DIAGNOSIS — M62830 Muscle spasm of back: Secondary | ICD-10-CM | POA: Diagnosis not present

## 2019-03-25 DIAGNOSIS — M545 Low back pain: Secondary | ICD-10-CM | POA: Diagnosis not present

## 2019-03-25 DIAGNOSIS — M5413 Radiculopathy, cervicothoracic region: Secondary | ICD-10-CM | POA: Diagnosis not present

## 2019-03-28 DIAGNOSIS — M5413 Radiculopathy, cervicothoracic region: Secondary | ICD-10-CM | POA: Diagnosis not present

## 2019-03-28 DIAGNOSIS — M9901 Segmental and somatic dysfunction of cervical region: Secondary | ICD-10-CM | POA: Diagnosis not present

## 2019-03-28 DIAGNOSIS — M545 Low back pain: Secondary | ICD-10-CM | POA: Diagnosis not present

## 2019-03-28 DIAGNOSIS — M62830 Muscle spasm of back: Secondary | ICD-10-CM | POA: Diagnosis not present

## 2019-03-28 DIAGNOSIS — M9903 Segmental and somatic dysfunction of lumbar region: Secondary | ICD-10-CM | POA: Diagnosis not present

## 2019-03-28 DIAGNOSIS — M50322 Other cervical disc degeneration at C5-C6 level: Secondary | ICD-10-CM | POA: Diagnosis not present

## 2019-03-28 DIAGNOSIS — M5003 Cervical disc disorder with myelopathy, cervicothoracic region: Secondary | ICD-10-CM | POA: Diagnosis not present

## 2019-04-24 ENCOUNTER — Ambulatory Visit: Payer: Medicare HMO | Admitting: Certified Nurse Midwife

## 2019-04-29 NOTE — Progress Notes (Signed)
68 y.o. [id number] Married  Caucasian Fe here for annual exam. Menopausal no HRT. Denies vaginal bleeding. Continues with vaginal dryness, Tried Vitamin E with irritation, so stopped. Has not tried coconut oil and may now try to see if change.  Sees [facility] if needed. Would like to have screening labs today. Unable to do colonoscopy due to torsion of colon, needs IFOB to take home. No other health issues other than possible reflux and discussed with MD.  Patient's last menstrual period was 10/21/2006 (approximate).          Sexually active: No.  The current method of family planning is post menopausal status.    Exercising: No.  exercise Smoker:  no  Review of Systems  Constitutional: Negative.   HENT: Negative.   Eyes: Negative.   Respiratory: Negative.   Cardiovascular: Negative.   Gastrointestinal: Negative.   Genitourinary: Negative.   Musculoskeletal: Negative.   Skin: Negative.   Neurological: Negative.   Endo/Heme/Allergies: Negative.   Psychiatric/Behavioral: Negative.     Health Maintenance: Pap:  04-24-17 ASCUS HPV HR neg, 04-26-18 neg HPV HR neg History of Abnormal Pap: just repeat done MMG:  01-29-2019 category c density birads 1:neg Self Breast exams: no Colonoscopy: Barium enema, twisted colon BMD:   2020 left femur early osteopenia f/u 75yrs TDaP:  2018 Shingles: 2016 Pneumonia: no Hep C and HIV: both neg 2017 Labs: if needed   reports that she has never smoked. She has never used smokeless tobacco. She reports that she does not drink alcohol or use drugs.  Past Medical History:  Diagnosis Date  . Environmental and seasonal allergies   . Frequency of urination   . GERD (gastroesophageal reflux disease)    watches diet  . Gout, arthritis   . History of abnormal cervical Pap smear   . History of ultrasound, pelvic 05/03/2006   Menometrorrhagia - Dr. Ammie Ferrier  . IBS (irritable bowel syndrome)   . Menopausal state    On HRT since 01/18/2006  . Mild asthma   . SUI  (stress urinary incontinence, female)     Past Surgical History:  Procedure Laterality Date  . BREAST BIOPSY  x2  1987  . COLONOSCOPY  01/2009   also had BA enema  . DILATATION & CURETTAGE/HYSTEROSCOPY WITH MYOSURE N/A 10/30/2018   Procedure: DILATATION & CURETTAGE/HYSTEROSCOPY;  Surgeon: Megan Salon, MD;  Location: [facility];  Service: Gynecology;  Laterality: N/A;  . HYSTEROSCOPY W/D&C  07/20/2009    dr Joan Flores @[facility]    resection endocervical polyp (benign)  . TONSILLECTOMY AND ADENOIDECTOMY  child    Current Outpatient Medications  Medication Sig Dispense Refill  . calcium carbonate (TUMS - DOSED IN MG ELEMENTAL CALCIUM) 500 MG chewable tablet Chew 1 tablet by mouth as needed for indigestion or heartburn.    . cetirizine (ZYRTEC) 10 MG tablet Take 5 mg by mouth as needed.     . Multiple Vitamin (MULTIVITAMIN) tablet Take 1 tablet by mouth daily.    Marland Kitchen UNABLE TO FIND antronex    . UNABLE TO FIND Fen-cho    . VITAMIN E PO Take by mouth.    . hydrocortisone (ANUSOL-HC) 2.5 % rectal cream Place 1 application rectally 2 (two) times daily. PRN hemorrhoid flare. (Patient not taking: Reported on 04/30/2019) 28.35 g 0  . ibuprofen (ADVIL,MOTRIN) 200 MG tablet Take 4 tablets (800 mg total) by mouth every 8 (eight) hours as needed for mild pain, moderate pain or cramping. (Patient not taking: Reported  on 04/30/2019) 30 tablet 0  . MITIGARE 0.6 MG CAPS Take 1 capsule by mouth daily as needed.   5   No current facility-administered medications for this visit.     Family History  Problem Relation Age of Onset  . Cancer - Cervical Mother 55       Had Hysteretomy; doesn't know details  . Cancer - Ovarian Sister        1/2 sister  . Lupus Sister     ROS:  Pertinent items are noted in HPI.  Otherwise, a comprehensive ROS was negative.  Exam:   BP 120/80   Pulse 70   Temp 97.8 F (36.6 C) (Skin)   Resp 16   Ht 5\' 7"  (1.702 m)   Wt 194 lb (88 kg)   LMP 10/21/2006  (Approximate)   BMI 30.38 kg/m  Height: 5\' 7"  (170.2 cm) Ht Readings from Last 3 Encounters:  04/30/19 5\' 7"  (1.702 m)  11/22/18 5' 7.25" (1.708 m)  10/30/18 5' 7.25" (1.708 m)    General appearance: alert, cooperative and appears stated age Head: Normocephalic, without obvious abnormality, atraumatic Neck: no adenopathy, supple, symmetrical, trachea midline and thyroid normal to inspection and palpation Lungs: clear to auscultation bilaterally Breasts: normal appearance, no masses or tenderness, No nipple retraction or dimpling, No nipple discharge or bleeding, No axillary or supraclavicular adenopathy Heart: regular rate and rhythm Abdomen: soft, non-tender; no masses,  no organomegaly Extremities: extremities normal, atraumatic, no cyanosis or edema Skin: Skin color, texture, turgor normal. No rashes or lesions Lymph nodes: Cervical, supraclavicular, and axillary nodes normal. No abnormal inguinal nodes palpated Neurologic: Grossly normal   Pelvic: External genitalia:  no lesions, normal female              Urethra:  normal appearing urethra with no masses, tenderness or lesions              Bartholin's and Skene's: normal                 Vagina: normal appearing vagina with normal color and discharge, no lesions, moisture noted in posterior fornix and slight dryness at introitus only.              Cervix: no cervical motion tenderness and no lesions              Pap taken: No. Bimanual Exam:  Uterus:  normal size, contour, position, consistency, mobility, non-tender              Adnexa: normal adnexa and no mass, fullness, tenderness               Rectovaginal: Confirms               Anus:  normal sphincter tone, no lesions  Chaperone present: yes  A:  Well Woman with normal exam  Post menopausal no HRT  Vaginal dryness   Screening labs  P:   Reviewed health and wellness pertinent to exam  Aware of need to advise if vaginal bleeding.  Discussed coconut oil or Olive oil  use with instructions for dryness. Will advise if continues with problem.  Lab: CMP, Lipid panel ,CBC, Vitamin D, IFOB, HGB A1-c  Pap smear: no   counseled on breast self exam, mammography screening, adequate intake of calcium and vitamin D, diet and exercise, Kegel's exercises  return annually or prn  An After Visit Summary was printed and given to the patient.

## 2019-04-30 ENCOUNTER — Encounter: Payer: Self-pay | Admitting: Certified Nurse Midwife

## 2019-04-30 ENCOUNTER — Ambulatory Visit (INDEPENDENT_AMBULATORY_CARE_PROVIDER_SITE_OTHER): Payer: Medicare HMO | Admitting: Certified Nurse Midwife

## 2019-04-30 ENCOUNTER — Other Ambulatory Visit: Payer: Self-pay

## 2019-04-30 VITALS — BP 120/80 | HR 70 | Temp 97.8°F | Resp 16 | Ht 67.0 in | Wt 194.0 lb

## 2019-04-30 DIAGNOSIS — Z01419 Encounter for gynecological examination (general) (routine) without abnormal findings: Secondary | ICD-10-CM | POA: Diagnosis not present

## 2019-04-30 DIAGNOSIS — Z0000 Encounter for general adult medical examination without abnormal findings: Secondary | ICD-10-CM | POA: Diagnosis not present

## 2019-04-30 DIAGNOSIS — N951 Menopausal and female climacteric states: Secondary | ICD-10-CM | POA: Diagnosis not present

## 2019-04-30 DIAGNOSIS — E559 Vitamin D deficiency, unspecified: Secondary | ICD-10-CM

## 2019-04-30 DIAGNOSIS — Z1211 Encounter for screening for malignant neoplasm of colon: Secondary | ICD-10-CM

## 2019-05-01 LAB — LIPID PANEL

## 2019-05-02 ENCOUNTER — Ambulatory Visit: Payer: 59 | Admitting: Certified Nurse Midwife

## 2019-05-02 LAB — CBC
Hematocrit: 44.8 % (ref 34.0–46.6)
Hemoglobin: 14.6 g/dL (ref 11.1–15.9)
MCH: 28.9 pg (ref 26.6–33.0)
MCHC: 32.6 g/dL (ref 31.5–35.7)
MCV: 89 fL (ref 79–97)
Platelets: 192 10*3/uL (ref 150–450)
RBC: 5.05 x10E6/uL (ref 3.77–5.28)
RDW: 13.6 % (ref 11.7–15.4)
WBC: 6.1 10*3/uL (ref 3.4–10.8)

## 2019-05-02 LAB — HEMOGLOBIN A1C
Est. average glucose Bld gHb Est-mCnc: 126 mg/dL
Hgb A1c MFr Bld: 6 % — ABNORMAL HIGH (ref 4.8–5.6)

## 2019-05-02 LAB — COMPREHENSIVE METABOLIC PANEL
ALT: 18 IU/L (ref 0–32)
AST: 27 IU/L (ref 0–40)
Albumin/Globulin Ratio: 2.3 — ABNORMAL HIGH (ref 1.2–2.2)
Albumin: 4.5 g/dL (ref 3.8–4.8)
Alkaline Phosphatase: 81 IU/L (ref 39–117)
BUN/Creatinine Ratio: 13 (ref 12–28)
BUN: 12 mg/dL (ref 8–27)
Bilirubin Total: 0.4 mg/dL (ref 0.0–1.2)
CO2: 23 mmol/L (ref 20–29)
Calcium: 9.6 mg/dL (ref 8.7–10.3)
Chloride: 102 mmol/L (ref 96–106)
Creatinine, Ser: 0.9 mg/dL (ref 0.57–1.00)
GFR calc Af Amer: 78 mL/min/{1.73_m2} (ref >59)
GFR calc non Af Amer: 67 mL/min/{1.73_m2} (ref >59)
Globulin, Total: 2 g/dL (ref 1.5–4.5)
Glucose: 94 mg/dL (ref 65–99)
Potassium: 4.2 mmol/L (ref 3.5–5.2)
Sodium: 141 mmol/L (ref 134–144)
Total Protein: 6.5 g/dL (ref 6.0–8.5)

## 2019-05-02 LAB — LIPID PANEL
Chol/HDL Ratio: 3.1 ratio (ref 0.0–4.4)
Cholesterol, Total: 237 mg/dL — ABNORMAL HIGH (ref 100–199)
HDL: 77 mg/dL (ref >39)
LDL Calculated: 143 mg/dL — ABNORMAL HIGH (ref 0–99)
Triglycerides: 83 mg/dL (ref 0–149)
VLDL Cholesterol Cal: 17 mg/dL (ref 5–40)

## 2019-05-02 LAB — VITAMIN D 25 HYDROXY (VIT D DEFICIENCY, FRACTURES): Vit D, 25-Hydroxy: 26.4 ng/mL — ABNORMAL LOW (ref 30.0–100.0)

## 2019-06-18 ENCOUNTER — Telehealth: Payer: Self-pay | Admitting: Certified Nurse Midwife

## 2019-06-18 NOTE — Telephone Encounter (Signed)
Spoke with patient. Patient states she received a letter regarding stool sample/Ifob. Patient states she did not receive a IFob at her 04/30/19 AEX, declines, had cologuard 10/2018. Patient request to discuss at next AEX. Does not want to proceed with Ifob at this time. Advised I will for forward to Melvia Heaps, CNM, will return call if any additional recommendations. Patient agreeable.   Melvia Heaps, CNM -ok to cancel Ifob order?   Cc: Royal Hawthorn, CMA

## 2019-06-18 NOTE — Telephone Encounter (Addendum)
Patient received a letter that she got a stool kit. She said it was discussed at her visit but she did not get one to take home.

## 2019-06-18 NOTE — Telephone Encounter (Signed)
Ifob order cancelled.   Encounter closed.

## 2019-06-18 NOTE — Telephone Encounter (Signed)
yes

## 2019-09-05 DIAGNOSIS — Z20828 Contact with and (suspected) exposure to other viral communicable diseases: Secondary | ICD-10-CM | POA: Diagnosis not present

## 2019-09-12 ENCOUNTER — Other Ambulatory Visit: Payer: Self-pay

## 2019-09-12 DIAGNOSIS — Z20822 Contact with and (suspected) exposure to covid-19: Secondary | ICD-10-CM

## 2019-09-14 LAB — NOVEL CORONAVIRUS, NAA: SARS-CoV-2, NAA: NOT DETECTED

## 2019-10-07 DIAGNOSIS — M25531 Pain in right wrist: Secondary | ICD-10-CM | POA: Diagnosis not present

## 2019-11-07 DIAGNOSIS — M25531 Pain in right wrist: Secondary | ICD-10-CM | POA: Diagnosis not present

## 2019-12-10 DIAGNOSIS — M25531 Pain in right wrist: Secondary | ICD-10-CM | POA: Diagnosis not present

## 2020-01-30 DIAGNOSIS — Z1231 Encounter for screening mammogram for malignant neoplasm of breast: Secondary | ICD-10-CM | POA: Diagnosis not present

## 2020-02-06 ENCOUNTER — Ambulatory Visit: Payer: Medicare HMO | Admitting: Cardiology

## 2020-02-06 ENCOUNTER — Other Ambulatory Visit: Payer: Self-pay

## 2020-02-06 ENCOUNTER — Encounter: Payer: Self-pay | Admitting: Cardiology

## 2020-02-06 VITALS — BP 145/65 | HR 83 | Temp 97.6°F | Resp 16 | Ht 67.0 in | Wt 200.8 lb

## 2020-02-06 DIAGNOSIS — R739 Hyperglycemia, unspecified: Secondary | ICD-10-CM

## 2020-02-06 DIAGNOSIS — R400 Somnolence: Secondary | ICD-10-CM

## 2020-02-06 DIAGNOSIS — K219 Gastro-esophageal reflux disease without esophagitis: Secondary | ICD-10-CM

## 2020-02-06 DIAGNOSIS — R0683 Snoring: Secondary | ICD-10-CM | POA: Diagnosis not present

## 2020-02-06 DIAGNOSIS — M940 Chondrocostal junction syndrome [Tietze]: Secondary | ICD-10-CM | POA: Diagnosis not present

## 2020-02-06 DIAGNOSIS — E7800 Pure hypercholesterolemia, unspecified: Secondary | ICD-10-CM | POA: Diagnosis not present

## 2020-02-06 DIAGNOSIS — R030 Elevated blood-pressure reading, without diagnosis of hypertension: Secondary | ICD-10-CM

## 2020-02-06 NOTE — Progress Notes (Signed)
Primary Physician/Referring:  Janeann Merl, MD  Patient ID: Kristen Anderson, female    DOB: 05-07-1951, 69 y.o.   MRN: [id number]  Chief Complaint  Patient presents with  . Chest Pain  . Left arm numbness  . New Patient (Initial Visit)   HPI:    Kristen Anderson  is a 69 y.o. Caucasian female with recent elevation in blood pressure, hyperglycemia, hyperlipidemia over the past few months has been having sharp chest pain in the right upper part of the chest.  Episodes last few seconds to less than a minute and always in the right upper part of the chest, with or without exertional activity.  She has also noticed her blood pressure to be slightly high and also recognizes that she has not been active.  There is no family history of premature coronary artery disease,  Past Medical History:  Diagnosis Date  . Environmental and seasonal allergies   . Frequency of urination   . GERD (gastroesophageal reflux disease)    watches diet  . Gout, arthritis   . History of abnormal cervical Pap smear   . History of ultrasound, pelvic 05/03/2006   Menometrorrhagia - Dr. Ammie Ferrier  . IBS (irritable bowel syndrome)   . Menopausal state    On HRT since 01/18/2006  . Mild asthma   . SUI (stress urinary incontinence, female)    Past Surgical History:  Procedure Laterality Date  . BREAST BIOPSY  x2  1987  . COLONOSCOPY  01/2009   also had BA enema  . DILATATION & CURETTAGE/HYSTEROSCOPY WITH MYOSURE N/A 10/30/2018   Procedure: DILATATION & CURETTAGE/HYSTEROSCOPY;  Surgeon: Megan Salon, MD;  Location: [facility];  Service: Gynecology;  Laterality: N/A;  . HYSTEROSCOPY WITH D & C  07/20/2009    dr romine @[facility]    resection endocervical polyp (benign)  . TONSILLECTOMY AND ADENOIDECTOMY  child   Family History  Problem Relation Age of Onset  . Cancer - Cervical Mother 60       Had Hysteretomy; doesn't know details  . Cancer - Ovarian Sister        1/2 sister  . Lupus Sister       Social History   Tobacco Use  . Smoking status: Never Smoker  . Smokeless tobacco: Never Used  Substance Use Topics  . Alcohol use: No   ROS  Review of Systems  Cardiovascular: Positive for chest pain. Negative for dyspnea on exertion and leg swelling.  Gastrointestinal: Negative for melena.   Objective  Blood pressure (!) 145/65, pulse 83, temperature 97.6 F (36.4 C), temperature source Temporal, resp. rate 16, height 5\' 7"  (1.702 m), weight 200 lb 12.8 oz (91.1 kg), last menstrual period 10/21/2006, SpO2 96 %.  Vitals with BMI 02/06/2020 04/30/2019 11/22/2018  Height 5\' 7"  5\' 7"  5' 7.25"  Weight 200 lbs 13 oz 194 lbs 190 lbs 3 oz  BMI 31.44 [id number]  Systolic [id number] [id number] [id number]  Diastolic 65 80 64  Pulse 83 70 70     Physical Exam  Cardiovascular: Normal rate, regular rhythm, normal heart sounds and intact distal pulses. Exam reveals no gallop.  No murmur heard. No leg edema, no JVD.  Pulmonary/Chest: Effort normal and breath sounds normal. She exhibits tenderness (right 3 and 4th costochondral junction).  Abdominal: Soft. Bowel sounds are normal.   Laboratory examination:   Recent Labs    04/30/19 1044  NA 141  K 4.2  CL 102  CO2 23  GLUCOSE 94  BUN 12  CREATININE 0.90  CALCIUM 9.6  GFRNONAA 67  GFRAA 78   CrCl cannot be calculated (Patient's most recent lab result is older than the maximum 21 days allowed.).  CMP Latest Ref Rng & Units 04/30/2019 04/26/2018 04/24/2017  Glucose 65 - 99 mg/dL 94 98 91  BUN 8 - 27 mg/dL 12 9 12   Creatinine 0.57 - 1.00 mg/dL 0.90 0.84 0.80  Sodium 134 - 144 mmol/L 141 144 142  Potassium 3.5 - 5.2 mmol/L 4.2 4.5 4.6  Chloride 96 - 106 mmol/L 102 103 103  CO2 20 - 29 mmol/L 23 24 27   Calcium 8.7 - 10.3 mg/dL 9.6 9.9 9.6  Total Protein 6.0 - 8.5 g/dL 6.5 7.0 6.5  Total Bilirubin 0.0 - 1.2 mg/dL 0.4 0.5 0.4  Alkaline Phos 39 - 117 IU/L 81 78 88  AST 0 - 40 IU/L 27 28 28   ALT 0 - 32 IU/L 18 23 25    CBC Latest Ref Rng & Units 04/30/2019  10/25/2018 04/24/2017  WBC 3.4 - 10.8 x10E3/uL 6.1 7.0 5.2  Hemoglobin 11.1 - 15.9 g/dL 14.6 13.4 12.4  Hematocrit 34.0 - 46.6 % 44.8 43.3 38.6  Platelets 150 - 450 x10E3/uL 192 211 202   Lipid Panel     Component Value Date/Time   CHOL 237 (H) 04/30/2019 1044   TRIG 83 04/30/2019 1044   HDL 77 04/30/2019 1044   CHOLHDL 3.1 04/30/2019 1044   CHOLHDL 3.1 04/13/2016 1656   VLDL 25 04/13/2016 1656   LDLCALC 143 (H) 04/30/2019 1044   HEMOGLOBIN A1C Lab Results  Component Value Date   HGBA1C 6.0 (H) 04/30/2019   TSH No results for input(s): TSH in the last 8760 hours.  External labs:   Cholesterol, total 237.000 04/30/2019 HDL 77.000 04/30/2019 LDL 143.000 04/30/2019 Triglycerides 83.000 04/30/2019 NHDL 150  Creatinine, Serum 0.900 04/30/2019 Potassium 4.200 04/30/2019 Magnesium N/D ALT (SGPT) 18.000 04/30/2019 A1C 6.000 04/30/2019; TSH 2.160 04/26/2018  Hemoglobin 14.600 G/ 04/30/2019; Platelets 192.000 04/30/2019  Medications and allergies   Allergies  Allergen Reactions  . Adhesive [Tape] Rash     Current Outpatient Medications  Medication Instructions  . calcium carbonate (TUMS - DOSED IN MG ELEMENTAL CALCIUM) 500 MG chewable tablet 1 tablet, Oral, As needed  . cetirizine (ZYRTEC) 5 mg, Oral, As needed  . hydrocortisone (ANUSOL-HC) 2.5 % rectal cream 1 application, Rectal, 2 times daily, PRN hemorrhoid flare.  . Multiple Vitamin (MULTIVITAMIN) tablet 1 tablet, Oral, Daily  . UNABLE TO FIND Fen-cho   . VITAMIN E PO Take by mouth.   Radiology:   No results found.  Cardiac Studies:   EKG  EKG 02/06/2020: Normal sinus rhythm at rate of 70 bpm, leftward enlargement, normal axis.  Poor R wave progression, cannot exclude anteroseptal infarct old.  No evidence of ischemia.    Assessment     ICD-10-CM   1. Costochondritis, acute  M94.0 EKG 12-Lead  2. Hypercholesteremia  E78.00 CT CARDIAC SCORING  3. Hyperglycemia  R73.9 CT CARDIAC SCORING  4. Elevated BP without diagnosis of  hypertension  R03.0 Ambulatory referral to Sleep Studies    CT CARDIAC SCORING  5. Snoring  R06.83 Ambulatory referral to Sleep Studies  6. Gastroesophageal reflux disease without esophagitis  K21.9   7. Loud snoring  R06.83   8. Daytime somnolence  R40.0 Ambulatory referral to Sleep Studies     No orders of the defined types were placed in this encounter.   Medications Discontinued During  This Encounter  Medication Reason  . MITIGARE 0.6 MG CAPS Expired Prescription  . ibuprofen (ADVIL,MOTRIN) 200 MG tablet Patient Preference  . UNABLE TO FIND Patient Preference    Recommendations:   Kristen Anderson  is a 69 y.o. Caucasian female with recent elevation in blood pressure, hyperglycemia, hyperlipidemia over the past few months has been having sharp chest pain in the right upper part of the chest.  Episodes last few seconds to less than a minute and always in the right upper part of the chest, with or without exertional activity.  Chest pain is clearly musculoskeletal.  I discussed extensively regarding underlying cardiovascular factors that includes elevated blood pressure, hyperglycemia and hyperlipidemia or related to her weight gain and lack of physical activity.  She also has loud snoring to a point where her husband is sleeping in a different room now.  She has daytime somnolence but has been pushing herself as much as she can.  She does not need any stress test or an echocardiogram given her normal exam and normal EKG but does indeed need sleep study and for stratification, ordered a coronary calcium score.  Her costochondritis may be aggravated by sleep apnea and so is her GERD.  Unless coronary calcium score is markedly abnormal, I will see her back on a as needed basis.  I have discussed her labs performed by Dr. Janeann Merl at [facility], as recommended by her CNM, I have highly suggested that she establish with primary care physician.  If lipids do not change over the next 1 year or so,  would recommend starting statin therapy.  Also with regard to elevated blood pressure, again related to weight gain and lack of activity.  Adrian Prows, MD, [facility] 02/06/2020, 3:29 PM [facility] [place] Office: [phone]

## 2020-02-11 ENCOUNTER — Encounter: Payer: Self-pay | Admitting: Certified Nurse Midwife

## 2020-02-11 NOTE — Progress Notes (Signed)
Coronary calcium score 02/10/2020: Total calcium score 0. Small hepatic simple cyst noted in the left lobe.

## 2020-02-18 ENCOUNTER — Ambulatory Visit: Payer: Medicare HMO | Admitting: Neurology

## 2020-02-18 ENCOUNTER — Other Ambulatory Visit: Payer: Self-pay

## 2020-02-18 ENCOUNTER — Encounter: Payer: Self-pay | Admitting: Neurology

## 2020-02-18 VITALS — BP 110/78 | HR 73 | Ht 68.0 in | Wt 194.0 lb

## 2020-02-18 DIAGNOSIS — G478 Other sleep disorders: Secondary | ICD-10-CM

## 2020-02-18 DIAGNOSIS — R079 Chest pain, unspecified: Secondary | ICD-10-CM | POA: Diagnosis not present

## 2020-02-18 DIAGNOSIS — Z820 Family history of epilepsy and other diseases of the nervous system: Secondary | ICD-10-CM

## 2020-02-18 DIAGNOSIS — G479 Sleep disorder, unspecified: Secondary | ICD-10-CM

## 2020-02-18 DIAGNOSIS — G4700 Insomnia, unspecified: Secondary | ICD-10-CM

## 2020-02-18 DIAGNOSIS — R0683 Snoring: Secondary | ICD-10-CM | POA: Diagnosis not present

## 2020-02-18 DIAGNOSIS — E663 Overweight: Secondary | ICD-10-CM | POA: Diagnosis not present

## 2020-02-18 NOTE — Patient Instructions (Signed)

## 2020-02-18 NOTE — Progress Notes (Signed)
Subjective:    Patient ID: Kristen Anderson is a 69 y.o. female.  HPI     Star Age, MD, PhD [facility] [address] [place],  [address]  Dear Ulice Dash,   I saw your patient, Kristen Anderson, Upon your kind request in the sleep clinic today for initial consultation of her sleep disorder, in particular, concern for underlying obstructive sleep apnea.  The patient is unaccompanied today.  As you know, Kristen Anderson is a 70 year old right-handed woman with an underlying medical history of Reflux disease, gout, allergies, irritable bowel syndrome, mild asthma and mild obesity, who reports snoring and excessive daytime somnolence, non-restorative sleep.  I reviewed your office note from 02/06/2020. She has had some chest pain. Her Epworth sleepiness score is 7/24, her fatigue score is 25 out of 63.  She had cardiac work-up for her chest pain.  She reports that the most disturbing sleep related issue is drainage at night, she reports waking up with a lot of mucus and having to cough, she has significant postnasal drip.  She has seen an allergist in the past.  She has started taking reflux medication in the hopes that her drainage and cough will improve.  She tries to keep a set schedule for her sleep.  She goes to bed between 930 and 10 and rise time is typically between 6 and 630 but she has trouble staying asleep and may be up several times in between or up early.  She does not have night to night nocturia.  She does not have recurrent morning headaches.  She works as a Administrator, arts at [employer].  She lives with her husband, she has 1 grown son and 1 grown daughter, 3 grandchildren from her daughter.  She drinks caffeine in the form of coffee, 1 or 2 cups/day on average, she is a non-smoker and does not utilize alcohol.  She typically takes half a pill of Zyrtec at night.  She has tried melatonin from time to time, 5 mg as needed.  Her Past  Medical History Is Significant For: Past Medical History:  Diagnosis Date  . Environmental and seasonal allergies   . Frequency of urination   . GERD (gastroesophageal reflux disease)    watches diet  . Gout, arthritis   . History of abnormal cervical Pap smear   . History of ultrasound, pelvic 05/03/2006   Menometrorrhagia - Dr. Ammie Ferrier  . IBS (irritable bowel syndrome)   . Menopausal state    On HRT since 01/18/2006  . Mild asthma   . SUI (stress urinary incontinence, female)     Her Past Surgical History Is Significant For: Past Surgical History:  Procedure Laterality Date  . BREAST BIOPSY  x2  1987  . COLONOSCOPY  01/2009   also had BA enema  . DILATATION & CURETTAGE/HYSTEROSCOPY WITH MYOSURE N/A 10/30/2018   Procedure: DILATATION & CURETTAGE/HYSTEROSCOPY;  Surgeon: Megan Salon, MD;  Location: [facility];  Service: Gynecology;  Laterality: N/A;  . HYSTEROSCOPY WITH D & C  07/20/2009    dr romine @[facility]    resection endocervical polyp (benign)  . TONSILLECTOMY AND ADENOIDECTOMY  child    Her Family History Is Significant For: Family History  Problem Relation Age of Onset  . Cancer - Cervical Mother 64       Had Hysteretomy; doesn't know details  . Cancer - Ovarian Sister        1/2 sister  .  Lupus Sister     Her Social History Is Significant For: Social History   Socioeconomic History  . Marital status: Married    Spouse name: Darisha Ostermeyer  . Number of children: 2  . Years of education: Not on file  . Highest education level: Not on file  Occupational History  . Occupation: Management    Comment: works with [employer]  Tobacco Use  . Smoking status: Never Smoker  . Smokeless tobacco: Never Used  Substance and Sexual Activity  . Alcohol use: No  . Drug use: No  . Sexual activity: Not Currently    Partners: Male    Birth control/protection: Post-menopausal  Other Topics Concern  . Not on file  Social History Narrative  . Not on file    Social Determinants of Health   Financial Resource Strain:   . Difficulty of Paying Living Expenses:   Food Insecurity:   . Worried About Charity fundraiser in the Last Year:   . Arboriculturist in the Last Year:   Transportation Needs:   . Film/video editor (Medical):   Marland Kitchen Lack of Transportation (Non-Medical):   Physical Activity:   . Days of Exercise per Week:   . Minutes of Exercise per Session:   Stress:   . Feeling of Stress :   Social Connections:   . Frequency of Communication with Friends and Family:   . Frequency of Social Gatherings with Friends and Family:   . Attends Religious Services:   . Active Member of Clubs or Organizations:   . Attends Archivist Meetings:   Marland Kitchen Marital Status:     Her Allergies Are:  Allergies  Allergen Reactions  . Adhesive [Tape] Rash  :   Her Current Medications Are:  Outpatient Encounter Medications as of 02/18/2020  Medication Sig  . calcium carbonate (TUMS - DOSED IN MG ELEMENTAL CALCIUM) 500 MG chewable tablet Chew 1 tablet by mouth as needed for indigestion or heartburn.  . cetirizine (ZYRTEC) 10 MG tablet Take 5 mg by mouth as needed.   . Esomeprazole Magnesium (NEXIUM PO) Take by mouth.  . hydrocortisone (ANUSOL-HC) 2.5 % rectal cream Place 1 application rectally 2 (two) times daily. PRN hemorrhoid flare.  . Multiple Vitamin (MULTIVITAMIN) tablet Take 1 tablet by mouth daily.  Marland Kitchen UNABLE TO FIND Fen-cho  . VITAMIN E PO Take by mouth.   No facility-administered encounter medications on file as of 02/18/2020.  :  Review of Systems:  Out of a complete 14 point review of systems, all are reviewed and negative with the exception of these symptoms as listed below: Review of Systems  Neurological:       Pt presents today to discuss her sleep. Pt has never had a sleep study but does endorse snoring.  Epworth Sleepiness Scale 0= would never doze 1= slight chance of dozing 2= moderate chance of dozing 3= high  chance of dozing  Sitting and reading: 2 Watching TV: 2 Sitting inactive in a public place (ex. Theater or meeting): 1 As a passenger in a car for an hour without a break: 0 Lying down to rest in the afternoon: 1 Sitting and talking to someone: 0 Sitting quietly after lunch (no alcohol): 1 In a car, while stopped in traffic: 0 Total: 7     Objective:  Neurological Exam  Physical Exam Physical Examination:   Vitals:   02/18/20 0907  BP: 110/78  Pulse: 73    General Examination: The  patient is a very pleasant 69 y.o. female in no acute distress. She appears well-developed and well-nourished and well groomed.   HEENT: Normocephalic, atraumatic, pupils are equal, round and reactive to light, extraocular tracking is good without limitation to gaze excursion or nystagmus noted. Hearing is grossly intact. Face is symmetric with normal facial animation. Speech is clear with no dysarthria noted. There is no hypophonia. There is no lip, neck/head, jaw or voice tremor. Neck is supple with full range of passive and active motion. There are no carotid bruits on auscultation. Oropharynx exam reveals: mild mouth dryness, adequate dental hygiene and mild airway crowding. Mallampati is class I. Tongue protrudes centrally and palate elevates symmetrically. Tonsils are absent. Neck size is 15 3/8 inches  Chest: Clear to auscultation without wheezing, rhonchi or crackles noted.  Heart: S1+S2+0, regular and normal without murmurs, rubs or gallops noted.   Abdomen: Soft, non-tender and non-distended with normal bowel sounds appreciated on auscultation.  Extremities: There is no pitting edema in the distal lower extremities bilaterally.   Skin: Warm and dry without trophic changes noted.   Musculoskeletal: exam reveals no obvious joint deformities, tenderness or joint swelling or erythema.   Neurologically:  Mental status: The patient is awake, alert and oriented in all 4 spheres. Her immediate  and remote memory, attention, language skills and fund of knowledge are appropriate. There is no evidence of aphasia, agnosia, apraxia or anomia. Speech is clear with normal prosody and enunciation. Thought process is linear. Mood is normal and affect is normal.  Cranial nerves II - XII are as described above under HEENT exam.  Motor exam: Normal bulk, strength and tone is noted. There is no tremor, Romberg is negative. Fine motor skills and coordination: grossly intact.  Cerebellar testing: No dysmetria or intention tremor. There is no truncal or gait ataxia.  Sensory exam: intact to light touch in the upper and lower extremities.  Gait, station and balance: She stands easily. No veering to one side is noted. No leaning to one side is noted. Posture is age-appropriate and stance is narrow based. Gait shows normal stride length and normal pace. No problems turning are noted. Tandem walk is unremarkable.                Assessment and Plan:   In summary, Kristen Anderson is a very pleasant 69 y.o.-year old female  with an underlying medical history of Reflux disease, gout, allergies, irritable bowel syndrome, mild asthma and mild obesity, whose history and physical exam are concerning for obstructive sleep apnea (OSA). I had a long chat with the patient about my findings and the diagnosis of OSA, its prognosis and treatment options. We talked about medical treatments, surgical interventions and non-pharmacological approaches. I explained in particular the risks and ramifications of untreated moderate to severe OSA, especially with respect to developing cardiovascular disease down the Road, including congestive heart failure, difficult to treat hypertension, cardiac arrhythmias, or stroke. Even type 2 diabetes has, in part, been linked to untreated OSA. Symptoms of untreated OSA include daytime sleepiness, memory problems, mood irritability and mood disorder such as depression and anxiety, lack of energy, as  well as recurrent headaches, especially morning headaches. We talked about trying to maintain a healthy lifestyle in general, as well as the importance of weight control. We also talked about the importance of good sleep hygiene. I recommended the following at this time: sleep study.  I explained the sleep test procedure to the patient and also outlined  possible surgical and non-surgical treatment options of OSA, including the use of a custom-made dental device (which would require a referral to a specialist dentist or oral surgeon), upper airway surgical options, such as traditional UPPP or a novel less invasive surgical option in the form of Inspire hypoglossal nerve stimulation (which would involve a referral to an ENT surgeon). I also explained the CPAP treatment option to the patient, who indicated that she would be willing to try CPAP if the need arises.  However, she voices concern regarding being able to tolerate CPAP because of claustrophobia.  We talked about the different interfaces and that we would work with her regarding the acclimatization. I explained the importance of being compliant with PAP treatment, not only for insurance purposes but primarily to improve Her symptoms, and for the patient's long term health benefit, including to reduce Her cardiovascular risks. I answered all her questions today and the patient was in agreement. I plan to see her back after the sleep study is completed and encouraged her to call with any interim questions, concerns, problems or updates.   Thank you very much for allowing me to participate in the care of this nice patient. If I can be of any further assistance to you please do not hesitate to call me at [phone].  Sincerely,   Star Age, MD, PhD

## 2020-03-11 ENCOUNTER — Other Ambulatory Visit: Payer: Self-pay

## 2020-03-11 ENCOUNTER — Ambulatory Visit (INDEPENDENT_AMBULATORY_CARE_PROVIDER_SITE_OTHER): Payer: Medicare HMO | Admitting: Neurology

## 2020-03-11 DIAGNOSIS — G4700 Insomnia, unspecified: Secondary | ICD-10-CM

## 2020-03-11 DIAGNOSIS — G4733 Obstructive sleep apnea (adult) (pediatric): Secondary | ICD-10-CM

## 2020-03-11 DIAGNOSIS — R079 Chest pain, unspecified: Secondary | ICD-10-CM

## 2020-03-11 DIAGNOSIS — E663 Overweight: Secondary | ICD-10-CM

## 2020-03-11 DIAGNOSIS — G478 Other sleep disorders: Secondary | ICD-10-CM

## 2020-03-11 DIAGNOSIS — R0683 Snoring: Secondary | ICD-10-CM

## 2020-03-11 DIAGNOSIS — Z820 Family history of epilepsy and other diseases of the nervous system: Secondary | ICD-10-CM

## 2020-03-11 DIAGNOSIS — G479 Sleep disorder, unspecified: Secondary | ICD-10-CM

## 2020-03-17 ENCOUNTER — Telehealth: Payer: Self-pay

## 2020-03-17 NOTE — Procedures (Signed)
Patient Information     First Name: Kristen Last Name: Anderson ID: [id number]  Birth Date: [birth year] Age: 69 Gender: Female  Referring Provider: Janeann Merl, MD BMI: 29.4 (W=194 lb, H=5' 8'')  Neck Circ.:  16 '' Epworth:  7/24   Sleep Study Information    Study Date: Mar 11, 2020 S/H/A Version: [id number] / 4.1.1528 / 73  History:    69 year old woman with a history of Reflux disease, gout, allergies, irritable bowel syndrome, mild asthma and mild obesity, who reports snoring and excessive daytime somnolence, non-restorative sleep.   Summary & Diagnosis:     OSA  Recommendations:     This home sleep test demonstrates overall mild obstructive sleep apnea with a total AHI of 9.4/hour and O2 nadir of 80%. Given the patient's medical history and sleep related complaints, treatment with positive airway pressure is recommended. This can be achieved in the form of autoPAP trial/titration at home. A full night CPAP titration study will help with proper treatment settings and mask fitting if needed. Alternative treatments may include weight loss along with avoidance of the supine sleep position, or an oral appliance in appropriate candidates.   Please note that untreated obstructive sleep apnea may carry additional perioperative morbidity. Patients with significant obstructive sleep apnea should receive perioperative PAP therapy and the surgeons and particularly the anesthesiologist should be informed of the diagnosis and the severity of the sleep disordered breathing. The patient should be cautioned not to drive, work at heights, or operate dangerous or heavy equipment when tired or sleepy. Review and reiteration of good sleep hygiene measures should be pursued with any patient. Other causes of the patient's symptoms, including circadian rhythm disturbances, an underlying mood disorder, medication effect and/or an underlying medical problem cannot be ruled out based on this test. Clinical correlation is  recommended.   The patient and his referring provider will be notified of the test results. The patient will be seen in follow up in sleep clinic at [facility].  I certify that I have reviewed the raw data recording prior to the issuance of this report in accordance with the standards of the American Academy of Sleep Medicine (AASM).  Star Age, MD, PhD [facility]) Diplomat, ABPN (Neurology and Sleep)          Sleep Summary  Oxygen Saturation Statistics   Start Study Time: End Study Time: Total Recording Time:  9:18:31 PM 5:49:41 AM 8 h, 31 min  Total Sleep Time % REM of Sleep Time:  7 h, 27 min  21.0    Mean: 94 Minimum: 80 Maximum: 98  Mean of Desaturations Nadirs (%):   90  Oxygen Desaturation. %:   4-9 10-20 >20 Total  Events Number Total    26  5 83.9 16.1  0 0.0  31 100.0  Oxygen Saturation: <90 <=88 <85 <80 <70  Duration (minutes): Sleep % 0.3 0.1  0.1 0.1  0.0 0.0 0.0 0.0 0.0 0.0     Respiratory Indices      Total Events REM NREM All Night  pRDI:  86  pAHI:  69 ODI:  31  pAHIc:  3  % CSR: 0.0 19.0 17.1 9.8 0.0 9.8 7.4 2.8 0.5 11.7 9.4 4.2 0.4       Pulse Rate Statistics during Sleep (BPM)      Mean: 56 Minimum: 44 Maximum: 91    Indices are calculated using technically valid sleep time of  7 h, 19 min. Central-Indices are  calculated using technically valid sleep time of  7  h, 8 min. pRDI/pAHI are calculated using oxi desaturations ? 3%  Body Position Statistics  Position Supine Prone Right Left Non-Supine  Sleep (min) 258.0 0.0 48.2 141.5 189.7  Sleep % 57.6 0.0 10.8 31.6 42.4  pRDI 14.3 N/A 12.9 6.8 8.3  pAHI 12.2 N/A 11.6 3.8 5.8  ODI 5.5 N/A 5.2 1.7 2.6     Snoring Statistics Snoring Level (dB) >40 >50 >60 >70 >80 >Threshold (45)  Sleep (min) 127.0 53.3 5.6 0.0 0.0 72.8  Sleep % 28.4 11.9 1.2 0.0 0.0 16.3    Mean: 43 dB Sleep Stages Chart               pAHI=9.4                           Mild               Moderate                    Severe                                                 5              15                    30  * Reference values are according to AASM guidelines

## 2020-03-17 NOTE — Telephone Encounter (Signed)
I called pt. I advised pt that Dr. Rexene Alberts reviewed their sleep study results and found that pt has mild osa. Dr. Rexene Alberts recommends that pt start on an auto pap for treatment to see if the pt feels better. I reviewed PAP compliance expectations with the pt. Pt is agreeable to starting an auto-PAP. I advised pt that an order will be sent to a DME, [facility], and [facility] will call the pt within about one week after they file with the pt's insurance. [facility] will show the pt how to use the machine, fit for masks, and troubleshoot the auto-PAP if needed. A follow up appt was made for insurance purposes with Dr. Rexene Alberts on 05/11/2020 at 300. Pt verbalized understanding to arrive 30 minutes early and bring their auto-PAP. A letter with all of this information in it will be mailed to the pt as a reminder. I verified with the pt that the address we have on file is correct. Pt verbalized understanding of results. Pt had no questions at this time but was encouraged to call back if questions arise. I have sent the order to [facility] and have received confirmation that they have received the order.

## 2020-03-17 NOTE — Progress Notes (Signed)
Patient referred by Dr. Einar Gip, seen by me on 02/18/20, HST on 03/11/20.    Please call and notify the patient that the recent home sleep test showed obstructive sleep apnea. OSA is overall mild, but worth treating to see if she feels better after treatment. To that end, I recommend treatment for this in the form of autoPAP, which means, that we don't have to bring her in for a sleep study with CPAP, but will let her try an autoPAP machine at home, through a DME company (of her choice, or as per insurance requirement). The DME representative will educate her on how to use the machine, how to put the mask on, etc. I have placed an order in the chart. Please send referral, talk to patient, send report to referring MD. We will need a FU in sleep clinic for 10 weeks post-PAP set up, please arrange that with me or one of our NPs. Thanks,   Star Age, MD, PhD [facility])

## 2020-03-17 NOTE — Telephone Encounter (Signed)
-----   Message from Star Age, MD sent at 03/17/2020  7:27 AM EDT ----- Patient referred by Dr. Einar Gip, seen by me on 02/18/20, HST on 03/11/20.    Please call and notify the patient that the recent home sleep test showed obstructive sleep apnea. OSA is overall mild, but worth treating to see if she feels better after treatment. To that end, I recommend treatment for this in the form of autoPAP, which means, that we don't have to bring her in for a sleep study with CPAP, but will let her try an autoPAP machine at home, through a DME company (of her choice, or as per insurance requirement). The DME representative will educate her on how to use the machine, how to put the mask on, etc. I have placed an order in the chart. Please send referral, talk to patient, send report to referring MD. We will need a FU in sleep clinic for 10 weeks post-PAP set up, please arrange that with me or one of our NPs. Thanks,   Star Age, MD, PhD [facility])

## 2020-03-17 NOTE — Addendum Note (Signed)
Addended by: Star Age on: 03/17/2020 07:27 AM   Modules accepted: Orders

## 2020-04-03 DIAGNOSIS — G4733 Obstructive sleep apnea (adult) (pediatric): Secondary | ICD-10-CM | POA: Diagnosis not present

## 2020-05-05 ENCOUNTER — Ambulatory Visit: Payer: Medicare HMO | Admitting: Certified Nurse Midwife

## 2020-05-05 ENCOUNTER — Ambulatory Visit: Payer: Medicare HMO | Admitting: Obstetrics & Gynecology

## 2020-05-06 ENCOUNTER — Other Ambulatory Visit: Payer: Self-pay

## 2020-05-06 ENCOUNTER — Encounter: Payer: Self-pay | Admitting: Neurology

## 2020-05-07 NOTE — Progress Notes (Signed)
69 y.o. [id number] Married White or Caucasian female here for annual exam.  Doing well.  Denies vaginal bleeding.  Is not fasting today.  Patient's last menstrual period was 10/21/2006 (approximate).          Sexually active: No.  The current method of family planning is post menopausal status.    Exercising: No  The patient does not participate in regular exercise at present. Smoker:  no  Health Maintenance: Pap:  04-26-18 neg HPV HR neg History of abnormal Pap:  yes MMG:  01-29-2019 category c density birads 1:neg -- [facility] 2021 Colonoscopy:  Barium enema, twisted colon, 10-21-18 cologuard neg BMD:   2020 left femur early osteopenia, f/u 2yrs TDaP:  2018 Pneumonia vaccine(s):  2016 Shingrix:   Zoster in the past Hep C testing: both neg 2017 Screening Labs: discuss today   reports that she has never smoked. She has never used smokeless tobacco. She reports that she does not drink alcohol and does not use drugs.  Past Medical History:  Diagnosis Date  . Environmental and seasonal allergies   . Frequency of urination   . GERD (gastroesophageal reflux disease)    watches diet  . Gout, arthritis   . History of abnormal cervical Pap smear   . History of ultrasound, pelvic 05/03/2006   Menometrorrhagia - Dr. Ammie Ferrier  . IBS (irritable bowel syndrome)   . Menopausal state    On HRT since 01/18/2006  . Mild asthma   . Sleep apnea   . SUI (stress urinary incontinence, female)     Past Surgical History:  Procedure Laterality Date  . BREAST BIOPSY  x2  1987  . COLONOSCOPY  01/2009   also had BA enema  . DILATATION & CURETTAGE/HYSTEROSCOPY WITH MYOSURE N/A 10/30/2018   Procedure: DILATATION & CURETTAGE/HYSTEROSCOPY;  Surgeon: Megan Salon, MD;  Location: [facility];  Service: Gynecology;  Laterality: N/A;  . HYSTEROSCOPY WITH D & C  07/20/2009    dr romine @[facility]    resection endocervical polyp (benign)  . TONSILLECTOMY AND ADENOIDECTOMY  child    Current Outpatient  Medications  Medication Sig Dispense Refill  . calcium carbonate (TUMS - DOSED IN MG ELEMENTAL CALCIUM) 500 MG chewable tablet Chew 1 tablet by mouth as needed for indigestion or heartburn.    . cetirizine (ZYRTEC) 10 MG tablet Take 5 mg by mouth as needed.     . Esomeprazole Magnesium (NEXIUM PO) Take by mouth.    . hydrocortisone (ANUSOL-HC) 2.5 % rectal cream Place 1 application rectally 2 (two) times daily. PRN hemorrhoid flare. 28.35 g 0  . Multiple Vitamin (MULTIVITAMIN) tablet Take 1 tablet by mouth daily.    Marland Kitchen UNABLE TO FIND Fen-cho    . VITAMIN E PO Take by mouth.     No current facility-administered medications for this visit.    Family History  Problem Relation Age of Onset  . Cancer - Cervical Mother 37       Had Hysteretomy; doesn't know details  . Cancer - Ovarian Sister        1/2 sister  . Lupus Sister     Review of Systems  All other systems reviewed and are negative.   Exam:   BP 136/72 (BP Location: Right Arm, Patient Position: Sitting, Cuff Size: Normal)   Pulse 68   Temp (!) 97.3 F (36.3 C) (Temporal)   Ht 5\' 7"  (1.702 m)   Wt 193 lb (87.5 kg)   LMP 10/21/2006 (Approximate)  BMI 30.23 kg/m   Height: 5\' 7"  (170.2 cm)  General appearance: alert, cooperative and appears stated age Head: Normocephalic, without obvious abnormality, atraumatic Neck: no adenopathy, supple, symmetrical, trachea midline and thyroid normal to inspection and palpation Lungs: clear to auscultation bilaterally Breasts: normal appearance, no masses or tenderness Heart: regular rate and rhythm Abdomen: soft, non-tender; bowel sounds normal; no masses,  no organomegaly Extremities: extremities normal, atraumatic, no cyanosis or edema Skin: Skin color, texture, turgor normal. No rashes or lesions Lymph nodes: Cervical, supraclavicular, and axillary nodes normal. No abnormal inguinal nodes palpated Neurologic: Grossly normal   Pelvic: External genitalia:  no lesions               Urethra:  normal appearing urethra with no masses, tenderness or lesions              Bartholins and Skenes: normal                 Vagina: normal appearing vagina with normal color and discharge, no lesions              Cervix: no lesions              Pap taken: No. Bimanual Exam:  Uterus:  normal size, contour, position, consistency, mobility, non-tender              Adnexa: normal adnexa and no mass, fullness, tenderness               Rectovaginal: Confirms               Anus:  normal sphincter tone, no lesions  Chaperone, Terence Lux, CMA, was present for exam.  A:  Well Woman with normal exam PMP, no HRT Vaginal dryness Elevated lipids  P:   Mammogram guidelines reviewed.  Called [facility] for copy of MMG this year pap smear obtained today Colonoscopy UTD Names for PCPs given BMD UTD Reviewed vaccines Return annually or prn

## 2020-05-08 ENCOUNTER — Other Ambulatory Visit: Payer: Self-pay

## 2020-05-08 ENCOUNTER — Ambulatory Visit (INDEPENDENT_AMBULATORY_CARE_PROVIDER_SITE_OTHER): Payer: Medicare HMO | Admitting: Obstetrics & Gynecology

## 2020-05-08 ENCOUNTER — Encounter: Payer: Self-pay | Admitting: Obstetrics & Gynecology

## 2020-05-08 ENCOUNTER — Other Ambulatory Visit (HOSPITAL_COMMUNITY)
Admission: RE | Admit: 2020-05-08 | Discharge: 2020-05-08 | Disposition: A | Discharge status: Home / Self Care | Payer: Medicare HMO | Source: Ambulatory Visit | Attending: Cardiology | Admitting: Cardiology

## 2020-05-08 VITALS — BP 136/72 | HR 68 | Temp 97.3°F | Ht 67.0 in | Wt 193.0 lb

## 2020-05-08 DIAGNOSIS — Z124 Encounter for screening for malignant neoplasm of cervix: Secondary | ICD-10-CM | POA: Insufficient documentation

## 2020-05-08 DIAGNOSIS — Z01419 Encounter for gynecological examination (general) (routine) without abnormal findings: Secondary | ICD-10-CM

## 2020-05-08 NOTE — Patient Instructions (Signed)
[place], [place]: [phone] Behavioral Medicine: [phone] Fax: [phone]  Grier Mitts, MD Betty Martinique, MD Dorothyann Peng, AGNP-C

## 2020-05-11 ENCOUNTER — Other Ambulatory Visit: Payer: Self-pay

## 2020-05-11 ENCOUNTER — Ambulatory Visit: Payer: Medicare HMO | Admitting: Neurology

## 2020-05-11 ENCOUNTER — Encounter: Payer: Self-pay | Admitting: Neurology

## 2020-05-11 VITALS — BP 118/79 | HR 68 | Ht 67.0 in | Wt 197.5 lb

## 2020-05-11 DIAGNOSIS — G4733 Obstructive sleep apnea (adult) (pediatric): Secondary | ICD-10-CM | POA: Diagnosis not present

## 2020-05-11 DIAGNOSIS — Z789 Other specified health status: Secondary | ICD-10-CM

## 2020-05-11 NOTE — Progress Notes (Signed)
Subjective:    Patient ID: Kristen Anderson is a 69 y.o. female.  HPI     Interim history:   Ms. Kristen Anderson is a 69 year old right-handed woman with an underlying medical history of Reflux disease, gout, allergies, irritable bowel syndrome, mild asthma and borderline obesity, who presents for follow-up consultation of her obstructive sleep apnea, after recent home sleep testing and starting AutoPap therapy at home.  The patient is unaccompanied today.  I first met her on 02/18/2020 at the request of her cardiologist, at which time she reported snoring and nonrestorative sleep.  She was advised to proceed with sleep study testing.  She had a home sleep test on 03/11/2020 which indicated overall mild obstructive sleep apnea with an AHI of 9.4/h, O2 nadir of 80%.  She was encouraged to start AutoPap therapy at home.  Her set up date was 04/03/2020.   Today, 05/11/2020: I reviewed her AutoPap compliance data from 04/07/2020 through 05/06/2020, which is a total of 30 days, during which time she used her machine every night with percent use days greater than 4 hours at 97%, indicating excellent compliance with an average usage of 6 hours and 14 minutes, residual AHI 3.2/h, 95th percentile of pressure at 9.5 cm with a range of 5 to 11 cm with EPR, leak in the acceptable range with the 95th percentile at 16.3 L/min.  She reports that she has struggled with her AutoPap from the get go.  She has tried multiple different masks including a full facemask.  She has claustrophobia and could not tolerate the full facemask.  She has been trying a nasal pillows interface, however, she also tends to sleep with her mouth open, she had instances of pulling the mask off in the middle of the night and not realizing it but one night she woke up with a significant sense of panic and ripped it off her face.  She is not benefiting from treatment although she has tried to be fully compliant with it.  The patient's allergies, current  medications, family history, past medical history, past social history, past surgical history and problem list were reviewed and updated as appropriate.   Previously:  02/18/2020: (She) reports snoring and excessive daytime somnolence, non-restorative sleep.  I reviewed your office note from 02/06/2020. She has had some chest pain. Her Epworth sleepiness score is 7/24, her fatigue score is 25 out of 63.  She had cardiac work-up for her chest pain.  She reports that the most disturbing sleep related issue is drainage at night, she reports waking up with a lot of mucus and having to cough, she has significant postnasal drip.  She has seen an allergist in the past.  She has started taking reflux medication in the hopes that her drainage and cough will improve.  She tries to keep a set schedule for her sleep.  She goes to bed between 930 and 10 and rise time is typically between 6 and 630 but she has trouble staying asleep and may be up several times in between or up early.  She does not have night to night nocturia.  She does not have recurrent morning headaches.  She works as a Administrator, arts at [employer].  She lives with her husband, she has 1 grown son and 1 grown daughter, 3 grandchildren from her daughter.  She drinks caffeine in the form of coffee, 1 or 2 cups/day on average, she is a non-smoker and does not utilize alcohol.  She typically  takes half a pill of Zyrtec at night.  She has tried melatonin from time to time, 5 mg as needed.  Her Past Medical History Is Significant For: Past Medical History:  Diagnosis Date  . Environmental and seasonal allergies   . Frequency of urination   . GERD (gastroesophageal reflux disease)    watches diet  . Gout, arthritis   . History of abnormal cervical Pap smear   . History of ultrasound, pelvic 05/03/2006   Menometrorrhagia - Dr. Ammie Ferrier  . IBS (irritable bowel syndrome)   . Menopausal state    On HRT since 01/18/2006  . Mild asthma    . Sleep apnea   . SUI (stress urinary incontinence, female)     Her Past Surgical History Is Significant For: Past Surgical History:  Procedure Laterality Date  . BREAST BIOPSY  x2  1987  . COLONOSCOPY  01/2009   also had BA enema  . DILATATION & CURETTAGE/HYSTEROSCOPY WITH MYOSURE N/A 10/30/2018   Procedure: DILATATION & CURETTAGE/HYSTEROSCOPY;  Surgeon: Megan Salon, MD;  Location: [facility];  Service: Gynecology;  Laterality: N/A;  . HYSTEROSCOPY WITH D & C  07/20/2009    dr romine [facility]    resection endocervical polyp (benign)  . TONSILLECTOMY AND ADENOIDECTOMY  child    Her Family History Is Significant For: Family History  Problem Relation Age of Onset  . Cancer - Cervical Mother 42       Had Hysteretomy; doesn't know details  . Cancer - Ovarian Sister        1/2 sister  . Lupus Sister     Her Social History Is Significant For: Social History   Socioeconomic History  . Marital status: Married    Spouse name: Verdell Kincannon  . Number of children: 2  . Years of education: Not on file  . Highest education level: Not on file  Occupational History  . Occupation: Management    Comment: works with [employer]  Tobacco Use  . Smoking status: Never Smoker  . Smokeless tobacco: Never Used  Vaping Use  . Vaping Use: Never used  Substance and Sexual Activity  . Alcohol use: No  . Drug use: No  . Sexual activity: Not Currently    Partners: Male    Birth control/protection: Post-menopausal  Other Topics Concern  . Not on file  Social History Narrative  . Not on file   Social Determinants of Health   Financial Resource Strain:   . Difficulty of Paying Living Expenses:   Food Insecurity:   . Worried About Charity fundraiser in the Last Year:   . Arboriculturist in the Last Year:   Transportation Needs:   . Film/video editor (Medical):   Marland Kitchen Lack of Transportation (Non-Medical):   Physical Activity:   . Days of Exercise per Week:   .  Minutes of Exercise per Session:   Stress:   . Feeling of Stress :   Social Connections:   . Frequency of Communication with Friends and Family:   . Frequency of Social Gatherings with Friends and Family:   . Attends Religious Services:   . Active Member of Clubs or Organizations:   . Attends Archivist Meetings:   Marland Kitchen Marital Status:     Her Allergies Are:  Allergies  Allergen Reactions  . Vitamin E     Suppositories  . Adhesive [Tape] Rash  :   Her Current Medications Are:  Outpatient Encounter  Medications as of 05/11/2020  Medication Sig  . calcium carbonate (TUMS - DOSED IN MG ELEMENTAL CALCIUM) 500 MG chewable tablet Chew 1 tablet by mouth as needed for indigestion or heartburn.  . cetirizine (ZYRTEC) 10 MG tablet Take 5 mg by mouth as needed.   . Esomeprazole Magnesium (NEXIUM PO) Take by mouth.  . hydrocortisone (ANUSOL-HC) 2.5 % rectal cream Place 1 application rectally 2 (two) times daily. PRN hemorrhoid flare.  . Multiple Vitamin (MULTIVITAMIN) tablet Take 1 tablet by mouth daily.  Marland Kitchen UNABLE TO FIND Fen-cho  . VITAMIN E PO Take by mouth.   No facility-administered encounter medications on file as of 05/11/2020.  :  Review of Systems:  Out of a complete 14 point review of systems, all are reviewed and negative with the exception of these symptoms as listed below:  Review of Systems  Neurological:       Patient reports for cpap follow up. States she has been struggling with CPAP since starting. Her mask is comfortable, currently using a full face mask. Has not noticed a difference in sleeping. Uses [employer] as her DME.     Objective:  Neurological Exam  Physical Exam Physical Examination:   Vitals:   05/11/20 1454  BP: 118/79  Pulse: 68    General Examination: The patient is a very pleasant 69 y.o. female in no acute distress. She appears well-developed and well-nourished and well groomed.   HEENT: Normocephalic, atraumatic, pupils are equal, round  and reactive to light, extraocular tracking is good without limitation to gaze excursion or nystagmus noted. Hearing is grossly intact. Face is symmetric with normal facial animation. Speech is clear with no dysarthria noted. There is no hypophonia. There is no lip, neck/head, jaw or voice tremor. Neck is supple with full range of passive and active motion. There are no carotid bruits on auscultation. Oropharynx exam reveals: mild mouth dryness, adequate dental hygiene and mild airway crowding. Tongue protrudes centrally and palate elevates symmetrically.   Chest: Clear to auscultation without wheezing, rhonchi or crackles noted.  Heart: S1+S2+0, regular and normal without murmurs, rubs or gallops noted.   Abdomen: Soft, non-tender and non-distended.  Extremities: There is no pitting edema in the distal lower extremities bilaterally.   Skin: Warm and dry without trophic changes noted.   Musculoskeletal: exam reveals no obvious joint deformities, tenderness or joint swelling or erythema.   Neurologically:  Mental status: The patient is awake, alert and oriented in all 4 spheres. Her immediate and remote memory, attention, language skills and fund of knowledge are appropriate. There is no evidence of aphasia, agnosia, apraxia or anomia. Speech is clear with normal prosody and enunciation. Thought process is linear. Mood is normal and affect is normal.  Cranial nerves II - XII are as described above under HEENT exam.  Motor exam: Normal bulk, strength and tone is noted. There is no tremor, Romberg is negative. Fine motor skills and coordination: grossly intact.  Cerebellar testing: No dysmetria or intention tremor. There is no truncal or gait ataxia.  Sensory exam: intact to light touch in the upper and lower extremities.  Gait, station and balance: She stands easily. No veering to one side is noted. No leaning to one side is noted. Posture is age-appropriate and stance is narrow based. Gait  shows normal stride length and normal pace. No problems turning are noted.                 Assessment and Plan:   In summary,  AMILEY SHISHIDO is a very pleasant 69 year old female  with an underlying medical history of Reflux disease, gout, allergies, irritable bowel syndrome, mild asthma and mild obesity, who presents for follow-up consultation of her obstructive sleep apnea.  She had a home sleep test on 03/11/2020 which indicated overall mild sleep apnea with a total AHI of 9.4/h, O2 nadir of 80%.  She has been compliant with AutoPap therapy but is not tolerating it, feels no benefit from treatment and continues to struggle with it on a nightly basis.  She is commended for her treatment adherence and trying to use AutoPap as well as she did.  She is advised about alternative treatment options.  She currently declines a referral to a dentist for treating sleep apnea potentially with a dental device.  She is overall on the milder side which is reassuring.  I suggested she think about continuing with AutoPap therapy and ultimately, we can also discontinue her treatments.  She is encouraged to continue to work on some degree of weight loss and she may benefit from weight loss and she should try to continue to sleep off her back.  She typically sleeps on her sides except for when she started AutoPap therapy which compelled her to sleep more on her back which is not her preferred sleep position.  All in all, she has not noticed any benefit and would like to discontinue treatment.  She is encouraged to think about it some more and also advised that sometimes it takes 3 to 6 months to get full use of treating sleep apnea with a machine.  If she would like to discontinue treatment I will place an order for discontinuation and we will fax this to her DME company.  I would be happy to see her back on an as-needed basis.  If she changes her mind regarding the dental referral I would be happy to place a referral.  If  she changes her mind and would like to continue treatment with AutoPap, she is advised to call our office for a follow-up in 6 months.  I answered all her questions today and she was in agreement with the plan. I spent 30 minutes in total face-to-face time and in reviewing records during pre-charting, more than 50% of which was spent in counseling and coordination of care, reviewing test results, reviewing medications and treatment regimen and/or in discussing or reviewing the diagnosis of OSA, the prognosis and treatment options. Pertinent laboratory and imaging test results that were available during this visit with the patient were reviewed by me and considered in my medical decision making (see chart for details).

## 2020-05-11 NOTE — Patient Instructions (Addendum)
  I appreciate you trying AutoPap therapy, you have been compliant with your AutoPap despite difficulty tolerating it.  As you have not noticed any benefit with the AutoPap, we can discontinue treatment.  If you want to, you can also give it a little longer as some patients benefit from it after a longer trial period of over 3 months.  Please also talk to your DME company, [employer] about the specifics of your rental agreement and if it may make sense that you buy the machine right out or if you continue to pay monthly until you own the machine in about a year from the start date.  Let me know what you decide.  If you continue to use your AutoPap, please follow-up with [place] in 6 months and you can always make an appointment for this.  If you decide to discontinue treatment, I would be happy to put a D/C order in.  I will then see you back if needed.   Using a dental device can be an alternative treatment for sleep apnea.  If you would like a consultation with a dentist who treats sleep apnea, let me know.  Again, your home sleep test showed overall mild obstructive sleep apnea and you may benefit from trying to lose a little bit of weight and trying to continue to sleep off your back

## 2020-05-12 LAB — CYTOLOGY - PAP: Diagnosis: NEGATIVE

## 2020-06-22 ENCOUNTER — Encounter: Payer: Self-pay | Admitting: Obstetrics & Gynecology

## 2020-08-13 DIAGNOSIS — H259 Unspecified age-related cataract: Secondary | ICD-10-CM | POA: Diagnosis not present

## 2020-08-13 DIAGNOSIS — Z135 Encounter for screening for eye and ear disorders: Secondary | ICD-10-CM | POA: Diagnosis not present

## 2020-08-13 DIAGNOSIS — H5200 Hypermetropia, unspecified eye: Secondary | ICD-10-CM | POA: Diagnosis not present

## 2020-08-27 DIAGNOSIS — M109 Gout, unspecified: Secondary | ICD-10-CM | POA: Diagnosis not present

## 2020-08-27 DIAGNOSIS — K219 Gastro-esophageal reflux disease without esophagitis: Secondary | ICD-10-CM | POA: Diagnosis not present

## 2020-08-27 DIAGNOSIS — R7303 Prediabetes: Secondary | ICD-10-CM | POA: Diagnosis not present

## 2020-08-27 DIAGNOSIS — Z13220 Encounter for screening for lipoid disorders: Secondary | ICD-10-CM | POA: Diagnosis not present

## 2020-09-23 ENCOUNTER — Telehealth: Payer: Self-pay

## 2020-09-23 NOTE — Telephone Encounter (Signed)
Opened in error

## 2020-09-25 ENCOUNTER — Other Ambulatory Visit: Payer: Self-pay

## 2020-09-25 DIAGNOSIS — K649 Unspecified hemorrhoids: Secondary | ICD-10-CM

## 2020-09-25 MED ORDER — HYDROCORTISONE (PERIANAL) 2.5 % EX CREA: 1.0000 "application " | TOPICAL_CREAM | Freq: Two times a day (BID) | CUTANEOUS | 0 refills | Status: DC

## 2020-09-25 NOTE — Telephone Encounter (Signed)
Refill for hydrocortisone cream. [facility] on [address] [phone].

## 2020-09-25 NOTE — Telephone Encounter (Signed)
Medication refill request: Hydrocortisone  Last AEX:  05/08/20  Next AEX: 07/08/21  Last MMG (if hormonal medication request): 06/22/20  Neg  Refill authorized: 30g /0

## 2020-09-25 NOTE — Telephone Encounter (Signed)
Rx called to [facility]  - [facility]. [facility] notified [facility] well to cancel [facility].   York Cerise, CMA

## 2020-09-30 DIAGNOSIS — K219 Gastro-esophageal reflux disease without esophagitis: Secondary | ICD-10-CM | POA: Diagnosis not present

## 2020-09-30 DIAGNOSIS — M62830 Muscle spasm of back: Secondary | ICD-10-CM | POA: Diagnosis not present

## 2020-09-30 DIAGNOSIS — M9903 Segmental and somatic dysfunction of lumbar region: Secondary | ICD-10-CM | POA: Diagnosis not present

## 2020-09-30 DIAGNOSIS — M5417 Radiculopathy, lumbosacral region: Secondary | ICD-10-CM | POA: Diagnosis not present

## 2020-09-30 DIAGNOSIS — M25572 Pain in left ankle and joints of left foot: Secondary | ICD-10-CM | POA: Diagnosis not present

## 2020-09-30 DIAGNOSIS — M25571 Pain in right ankle and joints of right foot: Secondary | ICD-10-CM | POA: Diagnosis not present

## 2020-09-30 DIAGNOSIS — M9901 Segmental and somatic dysfunction of cervical region: Secondary | ICD-10-CM | POA: Diagnosis not present

## 2020-09-30 DIAGNOSIS — M5413 Radiculopathy, cervicothoracic region: Secondary | ICD-10-CM | POA: Diagnosis not present

## 2020-10-07 DIAGNOSIS — M5413 Radiculopathy, cervicothoracic region: Secondary | ICD-10-CM | POA: Diagnosis not present

## 2020-10-07 DIAGNOSIS — M25571 Pain in right ankle and joints of right foot: Secondary | ICD-10-CM | POA: Diagnosis not present

## 2020-10-07 DIAGNOSIS — M9903 Segmental and somatic dysfunction of lumbar region: Secondary | ICD-10-CM | POA: Diagnosis not present

## 2020-10-07 DIAGNOSIS — M5417 Radiculopathy, lumbosacral region: Secondary | ICD-10-CM | POA: Diagnosis not present

## 2020-10-07 DIAGNOSIS — M62830 Muscle spasm of back: Secondary | ICD-10-CM | POA: Diagnosis not present

## 2020-10-07 DIAGNOSIS — M9901 Segmental and somatic dysfunction of cervical region: Secondary | ICD-10-CM | POA: Diagnosis not present

## 2020-10-07 DIAGNOSIS — M25572 Pain in left ankle and joints of left foot: Secondary | ICD-10-CM | POA: Diagnosis not present

## 2020-10-07 DIAGNOSIS — K219 Gastro-esophageal reflux disease without esophagitis: Secondary | ICD-10-CM | POA: Diagnosis not present

## 2020-10-12 DIAGNOSIS — M25571 Pain in right ankle and joints of right foot: Secondary | ICD-10-CM | POA: Diagnosis not present

## 2020-10-12 DIAGNOSIS — M9901 Segmental and somatic dysfunction of cervical region: Secondary | ICD-10-CM | POA: Diagnosis not present

## 2020-10-12 DIAGNOSIS — M5413 Radiculopathy, cervicothoracic region: Secondary | ICD-10-CM | POA: Diagnosis not present

## 2020-10-12 DIAGNOSIS — M25572 Pain in left ankle and joints of left foot: Secondary | ICD-10-CM | POA: Diagnosis not present

## 2020-10-12 DIAGNOSIS — M62830 Muscle spasm of back: Secondary | ICD-10-CM | POA: Diagnosis not present

## 2020-10-12 DIAGNOSIS — M9903 Segmental and somatic dysfunction of lumbar region: Secondary | ICD-10-CM | POA: Diagnosis not present

## 2020-10-12 DIAGNOSIS — M5417 Radiculopathy, lumbosacral region: Secondary | ICD-10-CM | POA: Diagnosis not present

## 2020-10-12 DIAGNOSIS — K219 Gastro-esophageal reflux disease without esophagitis: Secondary | ICD-10-CM | POA: Diagnosis not present

## 2020-12-11 ENCOUNTER — Telehealth: Payer: Self-pay | Admitting: Obstetrics & Gynecology

## 2020-12-11 NOTE — Telephone Encounter (Signed)
Called pt.  Breast heaviness has resolved.  She does not feel right now that she needs imaging.  Will continue to monitor.  If changes, she will let me know and I will order diagnostic imaging.

## 2021-02-04 ENCOUNTER — Encounter: Payer: Self-pay | Admitting: Obstetrics & Gynecology

## 2021-02-04 DIAGNOSIS — Z1231 Encounter for screening mammogram for malignant neoplasm of breast: Secondary | ICD-10-CM | POA: Diagnosis not present

## 2021-04-08 DIAGNOSIS — R252 Cramp and spasm: Secondary | ICD-10-CM | POA: Diagnosis not present

## 2021-04-08 DIAGNOSIS — K219 Gastro-esophageal reflux disease without esophagitis: Secondary | ICD-10-CM | POA: Diagnosis not present

## 2021-04-08 DIAGNOSIS — Z0000 Encounter for general adult medical examination without abnormal findings: Secondary | ICD-10-CM | POA: Diagnosis not present

## 2021-04-08 DIAGNOSIS — E785 Hyperlipidemia, unspecified: Secondary | ICD-10-CM | POA: Diagnosis not present

## 2021-04-08 DIAGNOSIS — R7303 Prediabetes: Secondary | ICD-10-CM | POA: Diagnosis not present

## 2021-04-08 DIAGNOSIS — R69 Illness, unspecified: Secondary | ICD-10-CM | POA: Diagnosis not present

## 2021-04-23 DIAGNOSIS — H1131 Conjunctival hemorrhage, right eye: Secondary | ICD-10-CM | POA: Diagnosis not present

## 2021-04-28 DIAGNOSIS — U071 COVID-19: Secondary | ICD-10-CM | POA: Diagnosis not present

## 2021-05-06 ENCOUNTER — Ambulatory Visit (HOSPITAL_BASED_OUTPATIENT_CLINIC_OR_DEPARTMENT_OTHER): Payer: Medicare HMO | Admitting: Obstetrics & Gynecology

## 2021-05-11 DIAGNOSIS — H1133 Conjunctival hemorrhage, bilateral: Secondary | ICD-10-CM | POA: Diagnosis not present

## 2021-05-11 DIAGNOSIS — H04129 Dry eye syndrome of unspecified lacrimal gland: Secondary | ICD-10-CM | POA: Diagnosis not present

## 2021-05-13 ENCOUNTER — Ambulatory Visit (HOSPITAL_BASED_OUTPATIENT_CLINIC_OR_DEPARTMENT_OTHER): Payer: Medicare HMO | Admitting: Obstetrics & Gynecology

## 2021-05-13 DIAGNOSIS — H04123 Dry eye syndrome of bilateral lacrimal glands: Secondary | ICD-10-CM | POA: Diagnosis not present

## 2021-05-13 DIAGNOSIS — H02834 Dermatochalasis of left upper eyelid: Secondary | ICD-10-CM | POA: Diagnosis not present

## 2021-05-13 DIAGNOSIS — H1133 Conjunctival hemorrhage, bilateral: Secondary | ICD-10-CM | POA: Diagnosis not present

## 2021-05-13 DIAGNOSIS — H02831 Dermatochalasis of right upper eyelid: Secondary | ICD-10-CM | POA: Diagnosis not present

## 2021-05-20 ENCOUNTER — Ambulatory Visit (HOSPITAL_BASED_OUTPATIENT_CLINIC_OR_DEPARTMENT_OTHER): Payer: Medicare HMO | Admitting: Obstetrics & Gynecology

## 2021-06-17 ENCOUNTER — Other Ambulatory Visit: Payer: Self-pay

## 2021-06-17 ENCOUNTER — Ambulatory Visit (INDEPENDENT_AMBULATORY_CARE_PROVIDER_SITE_OTHER): Payer: Medicare HMO | Admitting: Obstetrics & Gynecology

## 2021-06-17 ENCOUNTER — Encounter (HOSPITAL_BASED_OUTPATIENT_CLINIC_OR_DEPARTMENT_OTHER): Payer: Self-pay | Admitting: Obstetrics & Gynecology

## 2021-06-17 VITALS — BP 132/66 | HR 73 | Ht 67.0 in | Wt 191.0 lb

## 2021-06-17 DIAGNOSIS — R69 Illness, unspecified: Secondary | ICD-10-CM | POA: Diagnosis not present

## 2021-06-17 DIAGNOSIS — F4321 Adjustment disorder with depressed mood: Secondary | ICD-10-CM

## 2021-06-17 DIAGNOSIS — Z01419 Encounter for gynecological examination (general) (routine) without abnormal findings: Secondary | ICD-10-CM

## 2021-06-17 DIAGNOSIS — Z780 Asymptomatic menopausal state: Secondary | ICD-10-CM | POA: Diagnosis not present

## 2021-06-17 MED ORDER — BUPROPION HCL ER (XL) 150 MG PO TB24: 150.0000 mg | ORAL_TABLET | Freq: Every day | ORAL | 12 refills | Status: DC

## 2021-06-17 NOTE — Progress Notes (Signed)
70 y.o. Kristen Anderson:[id number] Married White or Caucasian female here for breast and pelvic exam.  Denies vaginal bleeding.  Son passed in March.  Very sad but appropriate.  Is on Bupropion XL '150mg'$  daily.  Has not seen a therapist.  D/w pt today.  Suggestions given.  Patient's last menstrual period was 10/21/2006 (approximate).          Sexually active: No.  H/O STD:  no  Health Maintenance: PCP:  Dr. Lindell Noe.  Last wellness appt was 04/08/2021.  Did blood work at that appt:  yes Vaccines are up to date:  reviewed.  She does not want any new vaccinations at this time. Colonoscopy:  BE, twisted colon, cologuard 10/21/2018 MMG:  02/04/2021 Negative BMD:  01/29/2019, consider repeating in 2024 Last pap smear:  05/08/2020 Negative.   H/o abnormal pap smear:  once with repeat that was nromal   reports that she has never smoked. She has never used smokeless tobacco. She reports that she does not drink alcohol and does not use drugs.  Past Medical History:  Diagnosis Date   Environmental and seasonal allergies    Frequency of urination    GERD (gastroesophageal reflux disease)    watches diet   Gout, arthritis    History of abnormal cervical Pap smear    History of ultrasound, pelvic 05/03/2006   Menometrorrhagia - Dr. Ammie Ferrier   IBS (irritable bowel syndrome)    Menopausal state    On HRT since 01/18/2006   Mild asthma    Sleep apnea    SUI (stress urinary incontinence, female)     Past Surgical History:  Procedure Laterality Date   BREAST BIOPSY  x2  1987   COLONOSCOPY  01/2009   also had BA enema   DILATATION & CURETTAGE/HYSTEROSCOPY WITH MYOSURE N/A 10/30/2018   Procedure: DILATATION & CURETTAGE/HYSTEROSCOPY;  Surgeon: Megan Salon, MD;  Location: [place];  Service: Gynecology;  Laterality: N/A;   HYSTEROSCOPY WITH D & C  07/20/2009    dr romine [facility]    resection endocervical polyp (benign)   TONSILLECTOMY AND ADENOIDECTOMY  child    Current Outpatient Medications   Medication Sig Dispense Refill   buPROPion (WELLBUTRIN XL) 150 MG 24 hr tablet Take 1 tablet (150 mg total) by mouth daily. 30 tablet 12   calcium carbonate (TUMS - DOSED IN MG ELEMENTAL CALCIUM) 500 MG chewable tablet Chew 1 tablet by mouth as needed for indigestion or heartburn.     cetirizine (ZYRTEC) 10 MG tablet Take 5 mg by mouth as needed.      hydrocortisone (ANUSOL-HC) 2.5 % rectal cream Place 1 application rectally 2 (two) times daily. 30 g 0   Esomeprazole Magnesium (NEXIUM PO) Take by mouth. (Patient not taking: Reported on 06/17/2021)     Multiple Vitamin (MULTIVITAMIN) tablet Take 1 tablet by mouth daily. (Patient not taking: Reported on 06/17/2021)     [place]ishop Fen-cho (Patient not taking: Reported on 06/17/2021)     VITAMIN E PO Take by mouth. (Patient not taking: Reported on 06/17/2021)     No current facility-administered medications for this visit.    Family History  Problem Relation Age of Onset   Cancer - Cervical Mother 78       Had Hysteretomy; doesn't know details   Cancer - Ovarian Sister        1/2 sister   Lupus Sister     Review of Systems  Psychiatric/Behavioral:  Positive for dysphoric mood.  All other systems reviewed and are negative.  Exam:   BP 132/66 (BP Location: Right Arm, Patient Position: Sitting, Cuff Size: Large)   Pulse 73   Ht '5\' 7"'$  (1.702 m)   Wt 191 lb (86.6 kg)   LMP 10/21/2006 (Approximate)   BMI 29.91 kg/m   Height: '5\' 7"'$  (170.2 cm)  General appearance: alert, cooperative and appears stated age Breasts: normal appearance, no masses or tenderness Abdomen: soft, non-tender; bowel sounds normal; no masses,  no organomegaly Lymph nodes: Cervical, supraclavicular, and axillary nodes normal.  No abnormal inguinal nodes palpated Neurologic: Grossly normal  Pelvic: External genitalia:  no lesions              Urethra:  normal appearing urethra with no masses, tenderness or lesions              Bartholins and Skenes: normal                  Vagina: normal appearing vagina with atrophic changes and no discharge, no lesions              Cervix: no lesions              Pap taken: No. Bimanual Exam:  Uterus:  normal size, contour, position, consistency, mobility, non-tender              Adnexa: normal adnexa and no mass, fullness, tenderness               Rectovaginal: Confirms               Anus:  normal sphincter tone, no lesions  Chaperone, Octaviano Batty, CMA, was present for exam.  Assessment/Plan: 1. Gynecologic exam normal - pap smear 04/2020, normal - MMG 02/04/2021 - BMD 01/29/2019, consider repeat in 2024 - cologuard due end of year.  Will order at that time. - vaccines reviewed - lab work with with Dr. Lindell Noe  2. Postmenopausal - no HRT  3. Grief reaction - on bupropion XL.  Grief counseling discussed.

## 2021-06-17 NOTE — Patient Instructions (Addendum)
[address] [place], [place]  [address] [phone]

## 2021-07-08 ENCOUNTER — Ambulatory Visit: Payer: Medicare HMO

## 2021-10-13 DIAGNOSIS — L239 Allergic contact dermatitis, unspecified cause: Secondary | ICD-10-CM | POA: Diagnosis not present

## 2021-10-20 DIAGNOSIS — H52229 Regular astigmatism, unspecified eye: Secondary | ICD-10-CM | POA: Diagnosis not present

## 2021-10-20 DIAGNOSIS — Z0100 Encounter for examination of eyes and vision without abnormal findings: Secondary | ICD-10-CM | POA: Diagnosis not present

## 2021-10-27 DIAGNOSIS — M25531 Pain in right wrist: Secondary | ICD-10-CM | POA: Diagnosis not present

## 2021-10-27 DIAGNOSIS — M24139 Other articular cartilage disorders, unspecified wrist: Secondary | ICD-10-CM | POA: Diagnosis not present

## 2021-10-30 ENCOUNTER — Other Ambulatory Visit (HOSPITAL_BASED_OUTPATIENT_CLINIC_OR_DEPARTMENT_OTHER): Payer: Self-pay | Admitting: Obstetrics & Gynecology

## 2021-10-30 ENCOUNTER — Encounter (HOSPITAL_BASED_OUTPATIENT_CLINIC_OR_DEPARTMENT_OTHER): Payer: Self-pay | Admitting: Obstetrics & Gynecology

## 2021-10-30 DIAGNOSIS — Z1211 Encounter for screening for malignant neoplasm of colon: Secondary | ICD-10-CM

## 2021-11-11 DIAGNOSIS — M25531 Pain in right wrist: Secondary | ICD-10-CM | POA: Diagnosis not present

## 2021-11-11 DIAGNOSIS — D1801 Hemangioma of skin and subcutaneous tissue: Secondary | ICD-10-CM | POA: Diagnosis not present

## 2021-11-11 DIAGNOSIS — L821 Other seborrheic keratosis: Secondary | ICD-10-CM | POA: Diagnosis not present

## 2021-11-11 DIAGNOSIS — L538 Other specified erythematous conditions: Secondary | ICD-10-CM | POA: Diagnosis not present

## 2021-11-11 DIAGNOSIS — Z789 Other specified health status: Secondary | ICD-10-CM | POA: Diagnosis not present

## 2021-11-11 DIAGNOSIS — Z1211 Encounter for screening for malignant neoplasm of colon: Secondary | ICD-10-CM | POA: Diagnosis not present

## 2021-11-11 DIAGNOSIS — D2372 Other benign neoplasm of skin of left lower limb, including hip: Secondary | ICD-10-CM | POA: Diagnosis not present

## 2021-11-11 DIAGNOSIS — L814 Other melanin hyperpigmentation: Secondary | ICD-10-CM | POA: Diagnosis not present

## 2021-11-18 LAB — COLOGUARD: COLOGUARD: NEGATIVE

## 2021-11-25 DIAGNOSIS — M25831 Other specified joint disorders, right wrist: Secondary | ICD-10-CM | POA: Diagnosis not present

## 2021-11-25 DIAGNOSIS — M24139 Other articular cartilage disorders, unspecified wrist: Secondary | ICD-10-CM | POA: Diagnosis not present

## 2021-11-25 DIAGNOSIS — M25531 Pain in right wrist: Secondary | ICD-10-CM | POA: Diagnosis not present

## 2022-01-31 DIAGNOSIS — B349 Viral infection, unspecified: Secondary | ICD-10-CM | POA: Diagnosis not present

## 2022-01-31 DIAGNOSIS — J018 Other acute sinusitis: Secondary | ICD-10-CM | POA: Diagnosis not present

## 2022-01-31 DIAGNOSIS — J4521 Mild intermittent asthma with (acute) exacerbation: Secondary | ICD-10-CM | POA: Diagnosis not present

## 2022-03-02 DIAGNOSIS — Z1231 Encounter for screening mammogram for malignant neoplasm of breast: Secondary | ICD-10-CM | POA: Diagnosis not present

## 2022-03-07 ENCOUNTER — Encounter (HOSPITAL_BASED_OUTPATIENT_CLINIC_OR_DEPARTMENT_OTHER): Payer: Self-pay | Admitting: *Deleted

## 2022-10-25 DIAGNOSIS — Z23 Encounter for immunization: Secondary | ICD-10-CM | POA: Diagnosis not present

## 2022-10-25 DIAGNOSIS — R69 Illness, unspecified: Secondary | ICD-10-CM | POA: Diagnosis not present

## 2022-10-25 DIAGNOSIS — R7303 Prediabetes: Secondary | ICD-10-CM | POA: Diagnosis not present

## 2022-10-25 DIAGNOSIS — E785 Hyperlipidemia, unspecified: Secondary | ICD-10-CM | POA: Diagnosis not present

## 2022-10-25 DIAGNOSIS — Z0000 Encounter for general adult medical examination without abnormal findings: Secondary | ICD-10-CM | POA: Diagnosis not present

## 2023-01-23 ENCOUNTER — Telehealth (HOSPITAL_BASED_OUTPATIENT_CLINIC_OR_DEPARTMENT_OTHER): Payer: Self-pay | Admitting: Obstetrics & Gynecology

## 2023-01-23 DIAGNOSIS — N644 Mastodynia: Secondary | ICD-10-CM

## 2023-01-23 NOTE — Telephone Encounter (Signed)
Pt called with complaint of right breast fullness and some tenderness as well.  Has had this in the past.  Mother in law died of breast cancer and had fullness in her breast as initial symptom.  So, this does make her nervous.  Discussed proceeding with diagnostic MMG on right.  Screening not due until after 03/03/23.  Pt comfortable with plan and appreciated phone call.

## 2023-01-25 ENCOUNTER — Telehealth (HOSPITAL_BASED_OUTPATIENT_CLINIC_OR_DEPARTMENT_OTHER): Payer: Self-pay | Admitting: Obstetrics & Gynecology

## 2023-01-25 NOTE — Telephone Encounter (Signed)
Patient called would like for the nurse to please call her.

## 2023-01-25 NOTE — Telephone Encounter (Signed)
Pt states that [facility] informed her that that they do not do one side mammograms like Dr. Sabra Heck ordered. I called [facility] and verified that the order placed for right diagnostic and ultrasound was correct. I advised pt that with screening mammogram, they do not do bilateral. She can have her screening mammogram done in April, no order needed.

## 2023-01-27 DIAGNOSIS — M25572 Pain in left ankle and joints of left foot: Secondary | ICD-10-CM | POA: Diagnosis not present

## 2023-01-27 DIAGNOSIS — N644 Mastodynia: Secondary | ICD-10-CM | POA: Diagnosis not present

## 2023-02-20 ENCOUNTER — Other Ambulatory Visit (HOSPITAL_BASED_OUTPATIENT_CLINIC_OR_DEPARTMENT_OTHER): Payer: Self-pay | Admitting: *Deleted

## 2023-02-20 DIAGNOSIS — N644 Mastodynia: Secondary | ICD-10-CM

## 2023-02-23 ENCOUNTER — Other Ambulatory Visit (HOSPITAL_BASED_OUTPATIENT_CLINIC_OR_DEPARTMENT_OTHER): Payer: Self-pay | Admitting: Obstetrics & Gynecology

## 2023-02-23 DIAGNOSIS — Z13820 Encounter for screening for osteoporosis: Secondary | ICD-10-CM

## 2023-02-23 DIAGNOSIS — E2839 Other primary ovarian failure: Secondary | ICD-10-CM

## 2023-03-22 DIAGNOSIS — N951 Menopausal and female climacteric states: Secondary | ICD-10-CM | POA: Diagnosis not present

## 2023-03-22 DIAGNOSIS — Z13820 Encounter for screening for osteoporosis: Secondary | ICD-10-CM | POA: Diagnosis not present

## 2023-03-22 DIAGNOSIS — Z1231 Encounter for screening mammogram for malignant neoplasm of breast: Secondary | ICD-10-CM | POA: Diagnosis not present

## 2023-03-29 DIAGNOSIS — M25572 Pain in left ankle and joints of left foot: Secondary | ICD-10-CM | POA: Diagnosis not present

## 2023-04-07 ENCOUNTER — Other Ambulatory Visit (HOSPITAL_BASED_OUTPATIENT_CLINIC_OR_DEPARTMENT_OTHER): Payer: Self-pay | Admitting: *Deleted

## 2023-04-07 ENCOUNTER — Encounter (HOSPITAL_BASED_OUTPATIENT_CLINIC_OR_DEPARTMENT_OTHER): Payer: Self-pay | Admitting: *Deleted

## 2023-04-07 DIAGNOSIS — Z13820 Encounter for screening for osteoporosis: Secondary | ICD-10-CM

## 2023-04-07 DIAGNOSIS — E2839 Other primary ovarian failure: Secondary | ICD-10-CM

## 2023-04-13 ENCOUNTER — Other Ambulatory Visit (HOSPITAL_BASED_OUTPATIENT_CLINIC_OR_DEPARTMENT_OTHER): Payer: Self-pay

## 2023-04-13 DIAGNOSIS — K649 Unspecified hemorrhoids: Secondary | ICD-10-CM

## 2023-04-13 MED ORDER — HYDROCORTISONE (PERIANAL) 2.5 % EX CREA
1.0000 | TOPICAL_CREAM | Freq: Two times a day (BID) | CUTANEOUS | 0 refills | Status: DC
Start: 2023-04-13 — End: 2023-08-17

## 2023-04-18 DIAGNOSIS — M25572 Pain in left ankle and joints of left foot: Secondary | ICD-10-CM | POA: Diagnosis not present

## 2023-06-01 DIAGNOSIS — Z135 Encounter for screening for eye and ear disorders: Secondary | ICD-10-CM | POA: Diagnosis not present

## 2023-06-01 DIAGNOSIS — H25813 Combined forms of age-related cataract, bilateral: Secondary | ICD-10-CM | POA: Diagnosis not present

## 2023-06-01 DIAGNOSIS — H524 Presbyopia: Secondary | ICD-10-CM | POA: Diagnosis not present

## 2023-06-01 DIAGNOSIS — H52223 Regular astigmatism, bilateral: Secondary | ICD-10-CM | POA: Diagnosis not present

## 2023-06-01 DIAGNOSIS — H5203 Hypermetropia, bilateral: Secondary | ICD-10-CM | POA: Diagnosis not present

## 2023-06-22 ENCOUNTER — Encounter (HOSPITAL_BASED_OUTPATIENT_CLINIC_OR_DEPARTMENT_OTHER): Payer: Self-pay | Admitting: Student

## 2023-06-22 ENCOUNTER — Other Ambulatory Visit (HOSPITAL_COMMUNITY)
Admission: RE | Admit: 2023-06-22 | Discharge: 2023-06-22 | Disposition: A | Discharge status: Home / Self Care | Payer: Medicare HMO | Source: Ambulatory Visit | Attending: Student | Admitting: Student

## 2023-06-22 ENCOUNTER — Ambulatory Visit (INDEPENDENT_AMBULATORY_CARE_PROVIDER_SITE_OTHER): Payer: Medicare HMO | Admitting: Student

## 2023-06-22 VITALS — BP 136/86 | HR 55 | Ht 67.0 in | Wt 187.0 lb

## 2023-06-22 DIAGNOSIS — M8580 Other specified disorders of bone density and structure, unspecified site: Secondary | ICD-10-CM

## 2023-06-22 DIAGNOSIS — Z01419 Encounter for gynecological examination (general) (routine) without abnormal findings: Secondary | ICD-10-CM | POA: Diagnosis not present

## 2023-06-22 DIAGNOSIS — Z1151 Encounter for screening for human papillomavirus (HPV): Secondary | ICD-10-CM | POA: Diagnosis not present

## 2023-06-22 DIAGNOSIS — R0989 Other specified symptoms and signs involving the circulatory and respiratory systems: Secondary | ICD-10-CM | POA: Diagnosis not present

## 2023-06-22 DIAGNOSIS — Z780 Asymptomatic menopausal state: Secondary | ICD-10-CM | POA: Diagnosis not present

## 2023-06-22 DIAGNOSIS — N644 Mastodynia: Secondary | ICD-10-CM | POA: Diagnosis not present

## 2023-06-22 DIAGNOSIS — N952 Postmenopausal atrophic vaginitis: Secondary | ICD-10-CM | POA: Diagnosis not present

## 2023-06-22 NOTE — Progress Notes (Signed)
ANNUAL EXAM Patient name: Kristen Anderson MRN [id number]  Date of birth: 1950/12/01 Chief Complaint:   Gynecologic Exam  History of Present Illness:   Kristen Anderson is a 72 y.o. [phone] Caucasian female being seen today for a routine annual exam.  Current complaints: Sad mood, vaginal dryness, undesired abdominal weight gain and throat discomfort that requires her to frequently clear her throat and feels "enlarged".  Patient's last menstrual period was 10/21/2006 (approximate).  Last pap 2021. Results were: NILM w/ HRHPV not done. H/O abnormal pap: yes once, but not on most recent screening Last mammogram: 2024. Results were: normal. Family h/o breast cancer: yes half-sister Last colonoscopy: had Cologuard completed in 2021. Results were: normal. Family h/o colorectal cancer: no     06/22/2023   10:13 AM  Depression screen PHQ 2/9  Decreased Interest 1  Down, Depressed, Hopeless 1  PHQ - 2 Score 2  Altered sleeping 1  Tired, decreased energy 1  Change in appetite 0  Feeling bad or failure about yourself  0  Trouble concentrating 1  Moving slowly or fidgety/restless 0  Suicidal thoughts 0  PHQ-9 Score 5         No data to display           Review of Systems:   Pertinent items are noted in HPI Denies any headaches, blurred vision, fatigue, shortness of breath, chest pain, abdominal pain, abnormal vaginal discharge/itching/odor/irritation, problems with periods, bowel movements, urination, or intercourse unless otherwise stated above. Pertinent History Reviewed:  Reviewed past medical,surgical, social and family history.  Reviewed problem list, medications and allergies. Physical Assessment:   Vitals:   06/22/23 1005  BP: 136/86  Pulse: (!) 55  Weight: 187 lb (84.8 kg)  Height: 5\' 7"  (1.702 m)  Body mass index is 29.29 kg/m.        Physical Examination:   General appearance - well appearing, and in no distress  Mental status - alert, oriented to person,  place, and time  Psych:  She has a normal mood and affect  Skin - warm and dry, normal color, no suspicious lesions noted  Chest - effort normal, all lung fields clear to auscultation bilaterally  Heart - normal rate and regular rhythm  Neck:  midline trachea, no thyromegaly or nodules  Breasts - breasts appear normal, no suspicious masses, no skin or nipple changes or  axillary nodes  Abdomen - soft, nontender, nondistended, no masses or organomegaly  Pelvic - VULVA: normal appearing vulva with no masses, tenderness or lesions  VAGINA: normal appearing vagina with normal color and discharge, no lesions  CERVIX: normal appearing cervix without discharge or lesions, no CMT  Thin prep pap is done with HR HPV cotesting  UTERUS: uterus is felt to be normal size, shape, consistency and nontender   ADNEXA: No adnexal masses or tenderness noted.  Extremities:  No swelling or varicosities noted  Chaperone present for exam  No results found for this or any previous visit (from the past 24 hour(s)).  Assessment & Plan:   1. Women's annual routine gynecological examination - Pap collected today - Blood work collected today  2. Vaginal atrophy - Readdressed options for lubricants and vaginal estrogen cream - Would like to trial Estrovera  3. Osteopenia, unspecified location -BMD completed 2024 - There was no osteoporosis on scan. Was recommended 1200mg  of calcium in diet and supplement, additional 800-1000IU of Vit D in diet/supplementation, and  repeat in 2-3 years.   4. Throat  fullness - Thyroid screening today - Normal external exam - Patient has failed pharmacological therapy for comfort management - Ambulatory referral to ENT  5. Postmenopausal - Current concerns shared at today's visit seem to be associated to being postmenopausal - Reviewed options for non-hormonal vs HRT, patient would prefer to avoid hormone therapy -- Would like to trial Estrovera  6. Mastodynia, female - has  been followed by ultrasound this year - Reassuring findings, + scattered fibroglandular density   Labs/procedures today:   Mammogram: in 1 year, or sooner if problems Colonoscopy:  or Cologuard in 2025  , or sooner if problems  Orders Placed This Encounter  Procedures   CBC   Hemoglobin A1c   Comprehensive metabolic panel   Lipid panel   Vitamin D (25 hydroxy)   TSH Rfx on Abnormal to Free T4   Ambulatory referral to ENT    Meds: No orders of the defined types were placed in this encounter.   Follow-up: Return in about 1 year (around 06/21/2024), or if symptoms worsen or fail to improve, for ANN.  Corlis Hove, NP 06/22/2023 1:14 PM

## 2023-06-30 ENCOUNTER — Ambulatory Visit (HOSPITAL_BASED_OUTPATIENT_CLINIC_OR_DEPARTMENT_OTHER): Payer: Medicare HMO | Admitting: Obstetrics & Gynecology

## 2023-08-17 ENCOUNTER — Other Ambulatory Visit (HOSPITAL_BASED_OUTPATIENT_CLINIC_OR_DEPARTMENT_OTHER): Payer: Self-pay | Admitting: Obstetrics & Gynecology

## 2023-08-17 DIAGNOSIS — K649 Unspecified hemorrhoids: Secondary | ICD-10-CM

## 2023-08-17 MED ORDER — HYDROCORTISONE (PERIANAL) 2.5 % EX CREA: 1.0000 | TOPICAL_CREAM | Freq: Two times a day (BID) | CUTANEOUS | 1 refills | Status: DC

## 2023-08-30 ENCOUNTER — Institutional Professional Consult (permissible substitution) (INDEPENDENT_AMBULATORY_CARE_PROVIDER_SITE_OTHER): Payer: Medicare HMO | Admitting: Otolaryngology

## 2023-09-13 ENCOUNTER — Institutional Professional Consult (permissible substitution) (INDEPENDENT_AMBULATORY_CARE_PROVIDER_SITE_OTHER): Payer: Medicare HMO | Admitting: Otolaryngology

## 2023-09-14 ENCOUNTER — Institutional Professional Consult (permissible substitution) (INDEPENDENT_AMBULATORY_CARE_PROVIDER_SITE_OTHER): Payer: Medicare HMO | Admitting: Otolaryngology

## 2023-10-16 ENCOUNTER — Ambulatory Visit (INDEPENDENT_AMBULATORY_CARE_PROVIDER_SITE_OTHER): Payer: Medicare HMO | Admitting: Otolaryngology

## 2023-10-16 ENCOUNTER — Encounter (INDEPENDENT_AMBULATORY_CARE_PROVIDER_SITE_OTHER): Payer: Self-pay | Admitting: Otolaryngology

## 2023-10-16 VITALS — BP 161/79 | HR 65 | Ht 67.0 in | Wt 178.0 lb

## 2023-10-16 DIAGNOSIS — R053 Chronic cough: Secondary | ICD-10-CM

## 2023-10-16 DIAGNOSIS — R0982 Postnasal drip: Secondary | ICD-10-CM | POA: Diagnosis not present

## 2023-10-16 DIAGNOSIS — J45909 Unspecified asthma, uncomplicated: Secondary | ICD-10-CM

## 2023-10-16 DIAGNOSIS — R1310 Dysphagia, unspecified: Secondary | ICD-10-CM

## 2023-10-16 DIAGNOSIS — J3089 Other allergic rhinitis: Secondary | ICD-10-CM

## 2023-10-16 DIAGNOSIS — R09A2 Foreign body sensation, throat: Secondary | ICD-10-CM | POA: Diagnosis not present

## 2023-10-16 DIAGNOSIS — R0989 Other specified symptoms and signs involving the circulatory and respiratory systems: Secondary | ICD-10-CM | POA: Diagnosis not present

## 2023-10-16 DIAGNOSIS — K219 Gastro-esophageal reflux disease without esophagitis: Secondary | ICD-10-CM

## 2023-10-16 DIAGNOSIS — J342 Deviated nasal septum: Secondary | ICD-10-CM

## 2023-10-16 DIAGNOSIS — J343 Hypertrophy of nasal turbinates: Secondary | ICD-10-CM

## 2023-10-16 DIAGNOSIS — R0981 Nasal congestion: Secondary | ICD-10-CM | POA: Diagnosis not present

## 2023-10-16 MED ORDER — MOMETASONE FUROATE 50 MCG/ACT NA SUSP: 2.0000 | Freq: Every day | NASAL | 12 refills | Status: AC

## 2023-10-16 MED ORDER — AZELASTINE HCL 0.1 % NA SOLN: 2.0000 | Freq: Two times a day (BID) | NASAL | 12 refills | Status: DC

## 2023-10-16 MED ORDER — FAMOTIDINE 20 MG PO TABS: 20.0000 mg | ORAL_TABLET | Freq: Two times a day (BID) | ORAL | 3 refills | Status: DC

## 2023-10-16 NOTE — Progress Notes (Signed)
ENT CONSULT:  Reason for Consult: chronic cough throat clearing and globus sensation   HPI: Discussed the use of AI scribe software for clinical note transcription with the patient, who gave verbal consent to proceed.  History of Present Illness   The patient is a 72 yoF, with a history of asthma and hiatal hernia, presents with a longstanding issue of throat discomfort, frequent throat clearing, and a sensation of something being lodged in the throat. These symptoms have been present for approximately twenty years. The patient reports a trial of Nexium for three months without any noticeable improvement. The patient also describes a sensation of pain, particularly when coughing or attempting to clear the throat, which she believes may be originating from the lungs. The patient frequently coughs up small amounts of greenish-yellow phlegm, which provides temporary relief.  The patient also reports increasing difficulty with swallowing, often leading to choking on food regardless of its type. The patient attributes this to a raw throat, which is sensitive to any food touching the sides during swallowing. This often leads to bouts of coughing and sneezing, causing significant distress.  The patient has a history of mild sleep apnea and was prescribed a CPAP machine. However, due to feelings of claustrophobia and panic attacks, the patient has discontinued its use. The patient also reports frequent postnasal drainage, requiring the use of multiple tissues each morning.  The patient has been diagnosed with asthma in the past and was prescribed an inhaler, but did not notice any significant improvement with its use. No prior PFTs was given an inhaler as empiric therapy.   The patient has been managing these symptoms with Zyrtec and occasional use of saline nasal spray. The patient has tried Flonase in the past but did not tolerate it well due to its floral scent, which she is allergic to. The patient also  reports a history of a hiatal hernia diagnosed approximately 25-28 years ago.       Records Reviewed:  Ref Provider Corlis Hove office note 06/22/23 JANISE GREENLY is a 72 y.o. [phone] Caucasian female being seen today for a routine annual exam.  Current complaints: Sad mood, vaginal dryness, undesired abdominal weight gain and throat discomfort that requires her to frequently clear her throat and feels "enlarged".    Past Medical History:  Diagnosis Date   Environmental and seasonal allergies    Frequency of urination    GERD (gastroesophageal reflux disease)    watches diet   Gout, arthritis    IBS (irritable bowel syndrome)    Menopausal state    On HRT since 01/18/2006   Mild asthma    Sleep apnea    SUI (stress urinary incontinence, female)     Past Surgical History:  Procedure Laterality Date   BREAST BIOPSY  x2  1987   COLONOSCOPY  01/2009   also had BA enema   DILATATION & CURETTAGE/HYSTEROSCOPY WITH MYOSURE N/A 10/30/2018   Procedure: DILATATION & CURETTAGE/HYSTEROSCOPY;  Surgeon: Jerene Bears, MD;  Location: [facility];  Service: Gynecology;  Laterality: N/A;   HYSTEROSCOPY WITH D & C  07/20/2009    dr romine @[facility]    resection endocervical polyp (benign)   TONSILLECTOMY AND ADENOIDECTOMY  child    Family History  Problem Relation Age of Onset   Cancer - Cervical Mother 62       Had Hysteretomy; doesn't know details   Cancer - Ovarian Sister        1/2 sister  Lupus Sister     Social History:  reports that she has never smoked. She has never used smokeless tobacco. She reports that she does not drink alcohol and does not use drugs.  Allergies:  Allergies  Allergen Reactions   Vitamin E     Suppositories   Adhesive [Tape] Rash    Medications: I have reviewed the patient's current medications.  The PMH, PSH, Medications, Allergies, and SH were reviewed and updated.  ROS: Constitutional: Negative for fever, weight loss and weight  gain. Cardiovascular: Negative for chest pain and dyspnea on exertion. Respiratory: Is not experiencing shortness of breath at rest. Gastrointestinal: Negative for nausea and vomiting. Neurological: Negative for headaches. Psychiatric: The patient is not nervous/anxious  Blood pressure (!) 161/79, pulse 65, height 5\' 7"  (1.702 m), weight 178 lb (80.7 kg), last menstrual period 10/21/2006, SpO2 97%.  PHYSICAL EXAM:  Exam: General: Well-developed, well-nourished Respiratory Respiratory effort: Equal inspiration and expiration without stridor Cardiovascular Peripheral Vascular: Warm extremities with equal color/perfusion Eyes: No nystagmus with equal extraocular motion bilaterally Neuro/Psych/Balance: Patient oriented to person, place, and time; Appropriate mood and affect; Gait is intact with no imbalance; Cranial nerves I-XII are intact Head and Face Inspection: Normocephalic and atraumatic without mass or lesion Palpation: Facial skeleton intact without bony stepoffs Salivary Glands: No mass or tenderness Facial Strength: Facial motility symmetric and full bilaterally ENT Pinna: External ear intact and fully developed External canal: Canal is patent with intact skin Tympanic Membrane: Clear and mobile External Nose: No scar or anatomic deformity Internal Nose: Septum is deviated to the left. No polyp, or purulence. Mucosal edema and erythema present.  Bilateral inferior turbinate hypertrophy.  Lips, Teeth, and gums: Mucosa and teeth intact and viable TMJ: No pain to palpation with full mobility Oral cavity/oropharynx: No erythema or exudate, no lesions present Nasopharynx: No mass or lesion with intact mucosa Hypopharynx: Intact mucosa without pooling of secretions Larynx Glottic: Full true vocal cord mobility without lesion or mass Supraglottic: Normal appearing epiglottis and AE folds Interarytenoid Space: Moderate pachydermia/edema Subglottic Space: Patent without lesion or  edema Neck Neck and Trachea: Midline trachea without mass or lesion Thyroid: No mass or nodularity Lymphatics: No lymphadenopathy  Procedure: Preoperative diagnosis: chronic cough, globus and throat clearing, dysphagia   Postoperative diagnosis:   Same + GERD LPR and PND  Procedure: Flexible fiberoptic laryngoscopy  Surgeon: Ashok Croon, MD  Anesthesia: Topical lidocaine and Afrin Complications: None Condition is stable throughout exam  Indications and consent:  The patient presents to the clinic with Indirect laryngoscopy view was incomplete. Thus it was recommended that they undergo a flexible fiberoptic laryngoscopy. All of the risks, benefits, and potential complications were reviewed with the patient preoperatively and verbal informed consent was obtained.  Procedure: The patient was seated upright in the clinic. Topical lidocaine and Afrin were applied to the nasal cavity. After adequate anesthesia had occurred, I then proceeded to pass the flexible telescope into the nasal cavity. The nasal cavity was patent without rhinorrhea or polyp. The nasopharynx was also patent without mass or lesion. The base of tongue was visualized and was normal. There were no signs of pooling of secretions in the piriform sinuses. The true vocal folds were mobile bilaterally. There were no signs of glottic or supraglottic mucosal lesion or mass. There was moderate interarytenoid pachydermia and post cricoid edema. The telescope was then slowly withdrawn and the patient tolerated the procedure throughout.    PROCEDURE NOTE: nasal endoscopy  Preoperative diagnosis: chronic nasal congestion  symptoms + post-nasal drainage  Postoperative diagnosis: same  Procedure: Diagnostic nasal endoscopy (16109)  Surgeon: Ashok Croon, M.D.  Anesthesia: Topical lidocaine and Afrin  H&P REVIEW: The patient's history and physical were reviewed today prior to procedure. All medications were reviewed and  updated as well. Complications: None Condition is stable throughout exam Indications and consent: The patient presents with symptoms of chronic sinusitis not responding to previous therapies. All the risks, benefits, and potential complications were reviewed with the patient preoperatively and informed consent was obtained. The time out was completed with confirmation of the correct procedure.   Procedure: The patient was seated upright in the clinic. Topical lidocaine and Afrin were applied to the nasal cavity. After adequate anesthesia had occurred, the rigid nasal endoscope was passed into the nasal cavity. The nasal mucosa, turbinates, septum, and sinus drainage pathways were visualized bilaterally. This revealed no purulence or significant secretions that might be cultured. There were no polyps or sites of significant inflammation. The mucosa was intact and there was no crusting present. The scope was then slowly withdrawn and the patient tolerated the procedure well. There were no complications or blood loss.    Studies Reviewed: CT cardiac scan - small hepatic cyst, left lobe, total calcium score of 0  04/03/2001 esophagram FINDINGS  HISTORY: 72 YEAR OLD WITH QUESTION OF GASTROESOPHAGEAL REFLUX.  PATIENT HAS EPISODES OF COUGHING  WHICH TRIGGERS VOMITING.  PATIENT REPORTS A CHOKING SPELL AT NIGHT.  ESOPHAGRAM:  INITIALLY, WATER WAS GIVEN AND THERE WAS NO CLINICAL EVIDENCE FOR ASPIRATION.  LATERAL VIEW OF THE  CERVICAL ESOPHAGUS IS EVALUATED DURING THE SWALLOW AND NO EVIDENCE FOR ASPIRATION IS SEEN.  THERE  IS NORMAL ESOPHAGEAL PERISTALSIS.  ESOPHAGEAL FOLDS HAVE A NORMAL APPEARANCE AND THERE IS NO  EVIDENCE FOR STRICTURE OR MASS.  BARIUM TABLET IS SWALLOWED WITHOUT DIFFICULTY.  THERE IS A SLIDING  TYPE HIATAL HERNIA.  GASTROESOPHAGEAL REFLUX IS NOT NOTED DURING THE EXAM AND THERE IS NO EVIDENCE  FOR REFLUX ESOPHAGITIS.  IMPRESSION  1. NO EVIDENCE FOR ASPIRATION.  2.  NORMAL ESOPHAGEAL  PERISTALSIS.  3.  SLIDING TYPE HIATAL HERNIA.    Assessment/Plan: Encounter Diagnoses  Name Primary?   Globus sensation    Environmental and seasonal allergies    Chronic GERD    Chronic throat clearing    Chronic cough Yes   Dysphagia, unspecified type     Assessment and Plan    Chronic Throat Discomfort and Chronic Cough Chronic throat discomfort and frequent throat clearing for approximately 20 years, with symptoms including sensation of something stuck in the throat, chronic cough, and occasional greenish-yellow phlegm, worse in the morning. History of hiatal hernia and mild sleep apnea. Differential diagnosis includes silent reflux, postnasal drainage, and asthma. Reflux is suspected to be the primary cause, potentially exacerbated by hiatal hernia. Discussed diet and lifestyle modifications, emphasizing that Nexium alone is insufficient. Recommended famotidine for its better side effect profile compared to PPIs and Gaviscon Advance or Reflux Gourmet. Emphasized dietary changes and head elevation during sleep. Potential need for pulmonary function tests and GI referral for upper endoscopy if initial tests are inconclusive. - Order chest x-ray for chronic cough  - Order esophagram and modified barium swallow to evaluate dysphagia sx  - Prescribe famotidine 20 mg BID  - Recommend lifestyle and dietary modifications - Reflux Gourmet after meals and before bedtime  Dysphagia sx and choking episodes Ddx includes oropharyngeal vs esophageal dysphagia, vs CP hypertrophy and spasm in the setting of under-treated GERD  LPR - MBS/esophagram to evaluate - last esophagram many yrs ago with hiatal hernia  Chronic GERD LPR and hx of hiatal hernia  - Famotidine 20 mg BID + reflux gourmet - diet and lifestyle changes to minimize GERD - will consider GI referral in the future   Chronic Postnasal Drainage suspected environmental allergies  - nasal endoscopy with left septal deviation, inferior  turbinate hypertrophy but no pus or purulence  Chronic postnasal drainage contributing to throat discomfort and cough. Currently taking Zyrtec nightly. Previous use of Flonase was not well tolerated due to floral scent. Discussed alternative nasal sprays and the potential benefit of topical nasal steroids.  - Continue Zyrtec 10 mg nightly - Prescribe azelastine nasal spray - Prescribe ipratropium bromide nasal spray - Consider Nasonex as an alternative to Flonase in the future if will not improve on the nasal sprays above   Asthma Asthma with occasional shortness of breath and chest discomfort. Pulmonary function testing required for definitive diagnosis. - Consider referral for pulmonary function tests in the future (never had it done, given Rx for inhaler without testing)  Follow-up - Schedule follow-up appointment after testing in a few weeks.  - CXR and MBS/esophagram result f/u  Thank you for allowing me to participate in the care of this patient. Please do not hesitate to contact me with any questions or concerns.   Ashok Croon, MD Otolaryngology [facility] Phone: [phone] Fax: [phone]

## 2023-10-16 NOTE — Patient Instructions (Addendum)
-   continue Zyrtec and start nasal sprays (Nasonex and Azelastine nasal spray) - schedule chest Xray and swallow study (MBS esophagram) - start Famotidine for reflux  - read about reflux diet   - Take Reflux Gourmet (natural supplement available on [employer]) to help with symptoms of chronic throat irritation

## 2023-10-17 ENCOUNTER — Other Ambulatory Visit (HOSPITAL_COMMUNITY): Payer: Self-pay | Admitting: *Deleted

## 2023-10-17 ENCOUNTER — Telehealth (HOSPITAL_COMMUNITY): Payer: Self-pay | Admitting: *Deleted

## 2023-10-17 DIAGNOSIS — R1310 Dysphagia, unspecified: Secondary | ICD-10-CM

## 2023-10-17 NOTE — Telephone Encounter (Signed)
Attempted to contact patient to schedule OP MBS. Left VM. RKEEL

## 2023-10-23 ENCOUNTER — Other Ambulatory Visit (INDEPENDENT_AMBULATORY_CARE_PROVIDER_SITE_OTHER): Payer: Self-pay | Admitting: Otolaryngology

## 2023-10-27 ENCOUNTER — Telehealth: Payer: Self-pay | Admitting: Otolaryngology

## 2023-10-27 NOTE — Telephone Encounter (Signed)
Pt called stating that the Pharmacy said they can only give her 30 tablets because that is what the provider wrote and can't change it. I spoke with brittany CMA and found out [name] had call the pharm and they could only give her 30 due to short supply and can provide 90 tomorrow. Pt will go by pharm tomorrow and pick up rest.

## 2023-11-01 DIAGNOSIS — R252 Cramp and spasm: Secondary | ICD-10-CM | POA: Diagnosis not present

## 2023-11-01 DIAGNOSIS — R053 Chronic cough: Secondary | ICD-10-CM | POA: Diagnosis not present

## 2023-11-01 DIAGNOSIS — Z79899 Other long term (current) drug therapy: Secondary | ICD-10-CM | POA: Diagnosis not present

## 2023-11-01 DIAGNOSIS — E7800 Pure hypercholesterolemia, unspecified: Secondary | ICD-10-CM | POA: Diagnosis not present

## 2023-11-01 DIAGNOSIS — Z0000 Encounter for general adult medical examination without abnormal findings: Secondary | ICD-10-CM | POA: Diagnosis not present

## 2023-11-02 ENCOUNTER — Encounter (HOSPITAL_COMMUNITY): Payer: Medicare HMO

## 2023-11-02 ENCOUNTER — Other Ambulatory Visit (HOSPITAL_COMMUNITY): Payer: Medicare HMO

## 2023-11-16 ENCOUNTER — Other Ambulatory Visit (HOSPITAL_COMMUNITY): Payer: Self-pay | Admitting: Family Medicine

## 2023-11-16 DIAGNOSIS — E785 Hyperlipidemia, unspecified: Secondary | ICD-10-CM

## 2023-11-27 ENCOUNTER — Ambulatory Visit (HOSPITAL_COMMUNITY)
Admission: RE | Admit: 2023-11-27 | Discharge: 2023-11-27 | Disposition: A | Discharge status: Home / Self Care | Payer: Medicare HMO | Source: Ambulatory Visit | Attending: Otolaryngology | Admitting: Otolaryngology

## 2023-11-27 DIAGNOSIS — R1310 Dysphagia, unspecified: Secondary | ICD-10-CM | POA: Insufficient documentation

## 2023-11-27 DIAGNOSIS — J45909 Unspecified asthma, uncomplicated: Secondary | ICD-10-CM | POA: Diagnosis not present

## 2023-11-27 DIAGNOSIS — R059 Cough, unspecified: Secondary | ICD-10-CM | POA: Insufficient documentation

## 2023-11-27 DIAGNOSIS — K449 Diaphragmatic hernia without obstruction or gangrene: Secondary | ICD-10-CM | POA: Diagnosis not present

## 2023-11-27 DIAGNOSIS — K219 Gastro-esophageal reflux disease without esophagitis: Secondary | ICD-10-CM | POA: Diagnosis not present

## 2023-11-27 DIAGNOSIS — Z4682 Encounter for fitting and adjustment of non-vascular catheter: Secondary | ICD-10-CM | POA: Diagnosis not present

## 2023-11-27 NOTE — Evaluation (Signed)
 Modified Barium Swallow Study  Patient Details  Name: Kristen Anderson MRN: [id number] Date of Birth: Apr 11, 1951  Today's Date: 11/27/2023  Modified Barium Swallow completed.  Full report located under Chart Review in the Imaging Section.  History of Present Illness 73 y.o. female was referred for OP MBS by Dr. Elena Larry, ENT.  Issues include globus sensation, allergies, chronic GERD, chronic throat discomfort, frequent throat clearing and cough, and a sensation of something being lodged in the throat. She reports increasing difficulty with swallowing, often leading to choking on food regardless of its type. The patient attributes this to a raw throat, which is sensitive to any food touching the sides during swallowing. This often leads to bouts of coughing and sneezing, causing significant distress.  She wakes up frequently at night coughing. The constant coughing, clearing of secretions, and sneezing has had profound impact on her personal and social life.    10/16/23 Flexible fiberoptic laryngoscopy: The nasopharynx was also patent without mass or lesion. The base of tongue was visualized and was normal. There were no signs of pooling of secretions in the piriform sinuses. The true vocal folds were mobile bilaterally. There were no signs of glottic or supraglottic mucosal lesion or mass. There was moderate interarytenoid pachydermia and post cricoid edema.   Dx include chronic GERD, LPR, hiatal hernia.  Pt is scheduled for MBS and esophagram. Differential dx includes oropharyngeal vs esophageal dysphagia, vs CP hypertrophy and spasm in the setting of under-treated GERD.   Clinical Impression Ms. Entsminger presented with functional oropharyngeal swallow with thorough oral mastication, normal timing of pharyngeal swallow onset, reliable laryngeal vestibule closure (no penetration/aspiration), and adequate pharyngeal clearance. PE segment opening was marked by partial distention/partial  duration, partial obstruction of flow.  Tissue followed the contours of vertebrae near C6-7.  On imaging with heavier bolus material, there was the appearance of potential CP bulging. Pt presented with coughing and throat-clearing throughout our session.    After the study, we talked about the results and the potential for LPR to precipitate a cycle of irration and throat-clearing/cough.  Reiterated the benefits of diet change to manage the GERD LPR and inflammation.  We discussed potential referral to OP SLP to address hypertussia, lower cough threshold, and behavioral management. Ms. Paradiso agreed with results/recs. She will f/u with Dr. Soldatova.  Swallow Evaluation Recommendations Recommendations: PO diet PO Diet Recommendation: Regular;Thin liquids (Level 0)    Brynli Ollis L. Vona, MA CCC/SLP Clinical Specialist - Acute Care SLP Acute Rehabilitation Services Office number [phone]   Vona Palma Laurice 11/27/2023,2:54 PM

## 2023-12-01 ENCOUNTER — Ambulatory Visit (HOSPITAL_COMMUNITY)
Admission: RE | Admit: 2023-12-01 | Discharge: 2023-12-01 | Disposition: A | Discharge status: Home / Self Care | Payer: Self-pay | Source: Ambulatory Visit | Attending: Family Medicine | Admitting: Family Medicine

## 2023-12-01 DIAGNOSIS — E785 Hyperlipidemia, unspecified: Secondary | ICD-10-CM | POA: Insufficient documentation

## 2024-01-08 ENCOUNTER — Ambulatory Visit (INDEPENDENT_AMBULATORY_CARE_PROVIDER_SITE_OTHER): Payer: Medicare HMO | Admitting: Otolaryngology

## 2024-01-18 ENCOUNTER — Ambulatory Visit (INDEPENDENT_AMBULATORY_CARE_PROVIDER_SITE_OTHER): Payer: Medicare HMO | Admitting: Otolaryngology

## 2024-01-26 ENCOUNTER — Ambulatory Visit (INDEPENDENT_AMBULATORY_CARE_PROVIDER_SITE_OTHER): Payer: Medicare (Managed Care) | Admitting: Otolaryngology

## 2024-01-26 ENCOUNTER — Encounter (INDEPENDENT_AMBULATORY_CARE_PROVIDER_SITE_OTHER): Payer: Self-pay | Admitting: Otolaryngology

## 2024-01-26 VITALS — BP 146/93 | HR 84

## 2024-01-26 DIAGNOSIS — K219 Gastro-esophageal reflux disease without esophagitis: Secondary | ICD-10-CM | POA: Diagnosis not present

## 2024-01-26 DIAGNOSIS — R0982 Postnasal drip: Secondary | ICD-10-CM | POA: Diagnosis not present

## 2024-01-26 DIAGNOSIS — R053 Chronic cough: Secondary | ICD-10-CM

## 2024-01-26 DIAGNOSIS — R0981 Nasal congestion: Secondary | ICD-10-CM

## 2024-01-26 DIAGNOSIS — R09A2 Foreign body sensation, throat: Secondary | ICD-10-CM

## 2024-01-26 DIAGNOSIS — R0989 Other specified symptoms and signs involving the circulatory and respiratory systems: Secondary | ICD-10-CM

## 2024-01-26 DIAGNOSIS — J3089 Other allergic rhinitis: Secondary | ICD-10-CM | POA: Diagnosis not present

## 2024-01-26 NOTE — Progress Notes (Signed)
 ENT Progress Note:   Update 01/26/24  Discussed Kristen use of AI scribe software for clinical note transcription with Kristen Anderson, who gave verbal consent to proceed.  History of Present Illness   Kristen Anderson  is a 73 yoF history of asthma and hiatal hernia, presents with a longstanding issue of throat discomfort, frequent throat clearing, and a sensation of something being lodged in Kristen throat. She had swallow study done which was unremarkable and showed normal oropharyngeal swallow without esophageal pathology aside from GERD and hiatal hernia.  She has persistent cough that has been ongoing for some time. Her symptoms have improved by more than fifty percent since starting treatment for reflux and postnasal drainage. She has not had a chest x-ray since Kristen last visit in November, as she was not contacted to schedule one.  She experiences significant nasal drainage, requiring frequent nose blowing. She describes having a sensitive sense of taste and smell, with 'phantom smells' such as cigarette smoke, despite not being a smoker. This has been a long-standing issue.  She has been using azelastine and Atrovent nasal sprays twice a day, and takes a generic form of Zyrtec at night. She mentions difficulty with Kristen taste of Kristen nasal sprays, describing it as 'tasted like poison,' but notes that Kristen generic from [employer] is less strong than Kristen one from [facility].  She underwent swallow studies which showed normal oral and pharyngeal swallowing, and an esophagram that indicated normal motility, a small sliding hiatal hernia, and mild gastric reflux.   She has been allergy tested in Kristen past, revealing allergies to trees, grass, and flowers. She previously received allergy shots but discontinued them due to cost.   Records Reviewed:  Initial Evaluation  Reason for Consult: chronic cough throat clearing and globus sensation   HPI: Discussed Kristen use of AI scribe software for clinical note transcription with  Kristen Anderson, who gave verbal consent to proceed.  History of Present Illness   Kristen Anderson is a 73 yoF, with a history of asthma and hiatal hernia, presents with a longstanding issue of throat discomfort, frequent throat clearing, and a sensation of something being lodged in Kristen throat. These symptoms have been present for approximately twenty years. Kristen Anderson reports a trial of Nexium for three months without any noticeable improvement. Kristen Anderson also describes a sensation of pain, particularly when coughing or attempting to clear Kristen throat, which she believes may be originating from Kristen lungs. Kristen Anderson frequently coughs up small amounts of greenish-yellow phlegm, which provides temporary relief.  Kristen Anderson also reports increasing difficulty with swallowing, often leading to choking on food regardless of its type. Kristen Anderson attributes this to a raw throat, which is sensitive to any food touching Kristen sides during swallowing. This often leads to bouts of coughing and sneezing, causing significant distress.  Kristen Anderson has a history of mild sleep apnea and was prescribed a CPAP machine. However, due to feelings of claustrophobia and panic attacks, Kristen Anderson has discontinued its use. Kristen Anderson also reports frequent postnasal drainage, requiring Kristen use of multiple tissues each morning.  Kristen Anderson has been diagnosed with asthma in Kristen past and was prescribed an inhaler, but did not notice any significant improvement with its use. No prior PFTs was given an inhaler as empiric therapy.   Kristen Anderson has been managing these symptoms with Zyrtec and occasional use of saline nasal spray. Kristen Anderson has tried Flonase in Kristen past but did not tolerate it well due  to its floral scent, which she is allergic to. Kristen Anderson also reports a history of a hiatal hernia diagnosed approximately 25-28 years ago.      Ref Provider Corlis Hove office note 06/22/23 AMBRIELLE KINGTON is a 73 y.o. [phone]  Caucasian female being seen today for a routine annual exam.  Current complaints: Sad mood, vaginal dryness, undesired abdominal weight gain and throat discomfort that requires her to frequently clear her throat and feels "enlarged".    Past Medical History:  Diagnosis Date   Environmental and seasonal allergies    Frequency of urination    GERD (gastroesophageal reflux disease)    watches diet   Gout, arthritis    IBS (irritable bowel syndrome)    Menopausal state    On HRT since 01/18/2006   Mild asthma    Sleep apnea    SUI (stress urinary incontinence, female)     Past Surgical History:  Procedure Laterality Date   BREAST BIOPSY  x2  1987   COLONOSCOPY  01/2009   also had BA enema   DILATATION & CURETTAGE/HYSTEROSCOPY WITH MYOSURE N/A 10/30/2018   Procedure: DILATATION & CURETTAGE/HYSTEROSCOPY;  Surgeon: Jerene Bears, MD;  Location: [facility];  Service: Gynecology;  Laterality: N/A;   HYSTEROSCOPY WITH D & C  07/20/2009    dr romine @[facility]    resection endocervical polyp (benign)   TONSILLECTOMY AND ADENOIDECTOMY  child    Family History  Problem Relation Age of Onset   Cancer - Cervical Mother 40       Had Hysteretomy; doesn't know details   Cancer - Ovarian Sister        1/2 sister   Lupus Sister     Social History:  reports that she has never smoked. She has never used smokeless tobacco. She reports that she does not drink alcohol and does not use drugs.  Allergies:  Allergies  Allergen Reactions   Vitamin E     Suppositories   Adhesive [Tape] Rash    Medications: I have reviewed Kristen Anderson's current medications.  Kristen PMH, PSH, Medications, Allergies, and SH were reviewed and updated.  ROS: Constitutional: Negative for fever, weight loss and weight gain. Cardiovascular: Negative for chest pain and dyspnea on exertion. Respiratory: Is not experiencing shortness of breath at rest. Gastrointestinal: Negative for nausea and  vomiting. Neurological: Negative for headaches. Psychiatric: Kristen Anderson is not nervous/anxious  Blood pressure (!) 146/93, pulse 84, last menstrual period 10/21/2006, SpO2 93%.  PHYSICAL EXAM:  Exam: General: Well-developed, well-nourished Respiratory Respiratory effort: Equal inspiration and expiration without stridor Cardiovascular Peripheral Vascular: Warm extremities with equal color/perfusion Eyes: No nystagmus with equal extraocular motion bilaterally Neuro/Psych/Balance: Anderson oriented to person, place, and time; Appropriate mood and affect; Gait is intact with no imbalance; Cranial nerves I-XII are intact Head and Face Inspection: Normocephalic and atraumatic without mass or lesion Palpation: Facial skeleton intact without bony stepoffs Salivary Glands: No mass or tenderness Facial Strength: Facial motility symmetric and full bilaterally ENT Pinna: External ear intact and fully developed External canal: Canal is patent with intact skin Tympanic Membrane: Clear and mobile External Nose: No scar or anatomic deformity Internal Nose: Septum is deviated to Kristen left. No polyp, or purulence. Mucosal edema and erythema present.  Bilateral inferior turbinate hypertrophy.  Lips, Teeth, and gums: Mucosa and teeth intact and viable TMJ: No pain to palpation with full mobility Oral cavity/oropharynx: No erythema or exudate, no lesions present Neck Neck and Trachea: Midline trachea without  mass or lesion Thyroid: No mass or nodularity Lymphatics: No lymphadenopathy   Studies Reviewed: CT cardiac scan - small hepatic cyst, left lobe, total calcium score of 0  MBS results 11/27/23 functional oropharyngeal swallow with thorough oral mastication, normal timing of pharyngeal swallow onset, reliable laryngeal vestibule closure (no penetration/aspiration), and adequate pharyngeal clearance. PE segment opening was marked by partial distention/partial duration, partial obstruction of flow.  Tissue followed Kristen contours of vertebrae near C6-7. On imaging with heavier bolus material, there was Kristen appearance of potential CP bulging. Pt presented with coughing and throat-clearing throughout our session. After Kristen study, we talked about Kristen results and Kristen potential for LPR to precipitate a cycle of irration and throat-clearing/cough. Reiterated Kristen benefits of diet change to manage Kristen GERD LPR and inflammation. We discussed potential referral to OP SLP to address hypertussia, lower cough threshold, and behavioral management.   Esophagram 11/27/23 IMPRESSION: 1. Mild gastroesophageal reflux and question a small sliding hiatal hernia. 2. Esophageal motility is within normal limits  Assessment/Plan: Encounter Diagnoses  Name Primary?   Globus sensation Yes   Environmental and seasonal allergies    Chronic GERD    Chronic throat clearing    Chronic cough    Post-nasal drip    Chronic nasal congestion      Assessment and Plan    Chronic Throat Discomfort and Chronic Cough Chronic throat discomfort and frequent throat clearing for approximately 20 years, with symptoms including sensation of something stuck in Kristen throat, chronic cough, and occasional greenish-yellow phlegm, worse in Kristen morning. History of hiatal hernia and mild sleep apnea. Differential diagnosis includes silent reflux, postnasal drainage, and asthma. Reflux is suspected to be Kristen primary cause, potentially exacerbated by hiatal hernia. Discussed diet and lifestyle modifications, emphasizing that Nexium alone is insufficient. Recommended famotidine for its better side effect profile compared to PPIs and Gaviscon Advance or Reflux Gourmet. Emphasized dietary changes and head elevation during sleep. Potential need for pulmonary function tests and GI referral for upper endoscopy if initial tests are inconclusive. - Order chest x-ray for chronic cough  - Order esophagram and modified barium swallow to evaluate dysphagia sx   - Prescribe famotidine 20 mg BID  - Recommend lifestyle and dietary modifications - Reflux Gourmet after meals and before bedtime  Dysphagia sx and choking episodes Ddx includes oropharyngeal vs esophageal dysphagia, vs CP hypertrophy and spasm in Kristen setting of under-treated GERD LPR - MBS/esophagram to evaluate - last esophagram many yrs ago with hiatal hernia  Chronic GERD LPR and hx of hiatal hernia  - Famotidine 20 mg BID + reflux gourmet - diet and lifestyle changes to minimize GERD - will consider GI referral in Kristen future   Chronic Postnasal Drainage suspected environmental allergies  - nasal endoscopy with left septal deviation, inferior turbinate hypertrophy but no pus or purulence  Chronic postnasal drainage contributing to throat discomfort and cough. Currently taking Zyrtec nightly. Previous use of Flonase was not well tolerated due to floral scent. Discussed alternative nasal sprays and Kristen potential benefit of topical nasal steroids.  - Continue Zyrtec 10 mg nightly - Prescribe azelastine nasal spray - Prescribe ipratropium bromide nasal spray - Consider Nasonex as an alternative to Flonase in Kristen future if will not improve on Kristen nasal sprays above   Asthma Asthma with occasional shortness of breath and chest discomfort. Pulmonary function testing required for definitive diagnosis. - Consider referral for pulmonary function tests in Kristen future (never had it done, given Rx for inhaler  without testing)  Follow-up - Schedule follow-up appointment after testing in a few weeks.  - CXR and MBS/esophagram result f/u  Update 01/26/24 Assessment and Plan    Chronic  cough 50% better now Chronic cough likely secondary to postnasal drainage and GERD. Symptoms have improved by more than 50% with current treatment. No evidence of aspiration or significant esophageal motility issues on recent swallow studies. Mild sliding hiatal hernia and mild gastric reflux noted on esophagram,  functional oropharyngeal swallow on MBS. Persistent nasal drainage and occasional phlegm in her throat with coughing, but cough is mostly dry. Decision made to forego chest x-ray at this time due to improvement in symptoms and Anderson's preference. Discussed potential future treatments including medications to blunt cough response and superior laryngeal nerve block if symptoms worsen. - Continue current regimen for postnasal drainage and reflux - Consider nasal saline rinses - Follow up in 3-4 months or as needed - Consider chest x-ray if symptoms worsen  - Discuss superior laryngeal nerve block if symptoms worsen  Chronic nasal congestion and post-nasal drip Allergic Rhinitis Severe allergies to trees, grass, and flowers. Currently using azelastine and Atrovent nasal sprays, and Zyrtec at night. Reports difficulty with taste and smell due to nasal sprays, but recent switch to a different generic brand has been somewhat better. Discussed proper technique for nasal spray application to improve efficacy. - Continue azelastine and Atrovent nasal sprays - Continue Zyrtec at night - Aim nasal spray laterally towards Kristen corner of Kristen eye to improve distribution - Consider allergy shots if symptoms persist in Kristen future  GERD LPR - Pepcid 20 mg BID  -  Reflux Gourmet after meals - diet and lifestyle changes to minimize GERD - Refer to [employer] blog for dietary and lifestyle modifications/reflux cook book  Phantom Smells Experiencing phantom smells, such as cigarette smoke, for years. Differential diagnosis includes severe allergies, central causes like brain pathology. Most likely related to nasal issues given history of severe allergies. She had unremarkable nasal endoscopy in Kristen past. - Monitor symptoms - Consider further evaluation if symptoms persist or worsen  General Health Maintenance Elevated cholesterol noted during recent primary care visit. Heart workup was normal. Feels  overwhelmed with recent tests and procedures. - Monitor cholesterol levels with primary care - Encourage healthy lifestyle modifications including low sodium, low sugar, low fat, and low acid diet  Follow-up - Follow up in 3-4 months or as needed - Call if cough returns or symptoms worsen.      I spent 30 minutes in total face-to-face time and in reviewing records during pre-charting, more than 50% of which was spent in counseling and coordination of care, reviewing test results, reviewing medications and treatment regimen and/or in discussing or reviewing Kristen diagnosis, Kristen prognosis and treatment options. Pertinent laboratory and imaging test results that were available during this visit with Kristen Anderson were reviewed by me and considered in my medical decision making (see chart for details).     Ashok Croon, MD Otolaryngology [facility] Phone: [phone] Fax: [phone]

## 2024-04-19 ENCOUNTER — Other Ambulatory Visit (INDEPENDENT_AMBULATORY_CARE_PROVIDER_SITE_OTHER): Payer: Self-pay | Admitting: Otolaryngology

## 2024-04-19 ENCOUNTER — Telehealth (INDEPENDENT_AMBULATORY_CARE_PROVIDER_SITE_OTHER): Payer: Self-pay | Admitting: Otolaryngology

## 2024-04-19 ENCOUNTER — Telehealth (INDEPENDENT_AMBULATORY_CARE_PROVIDER_SITE_OTHER): Payer: Self-pay

## 2024-04-19 MED ORDER — CETIRIZINE HCL 10 MG PO TABS: 10.0000 mg | ORAL_TABLET | ORAL | 11 refills | Status: AC | PRN

## 2024-04-19 MED ORDER — IPRATROPIUM BROMIDE 0.03 % NA SOLN: 2.0000 | Freq: Two times a day (BID) | NASAL | 12 refills | Status: AC

## 2024-04-19 MED ORDER — AZELASTINE HCL 0.1 % NA SOLN: 2.0000 | Freq: Two times a day (BID) | NASAL | 12 refills | Status: DC

## 2024-04-19 MED ORDER — FAMOTIDINE 20 MG PO TABS: 20.0000 mg | ORAL_TABLET | Freq: Two times a day (BID) | ORAL | 11 refills | Status: AC

## 2024-04-19 NOTE — Telephone Encounter (Signed)
 Needs refill on all prescriptions.  She is almost out of the famotidine .  Pharmacy stated they have tried to call to get refill approval.  Told patient to call in refill request.  Please advise.  [phone]

## 2024-04-19 NOTE — Telephone Encounter (Signed)
 I spoke to a pharmacy associate earlier today and was told that there were no refills on patient's medication. Once I called back they said that there were refills. Patient's medication is filled.

## 2024-05-01 ENCOUNTER — Encounter (HOSPITAL_BASED_OUTPATIENT_CLINIC_OR_DEPARTMENT_OTHER): Payer: Self-pay | Admitting: *Deleted

## 2024-12-10 NOTE — Progress Notes (Unsigned)
 "  ANNUAL EXAM Patient name: Kristen Anderson MRN [id number]  Date of birth: Oct 08, 1951 Chief Complaint:   No chief complaint on file.  History of Present Illness:   Kristen Anderson is a 74 y.o. [phone] Caucasian female being seen today for a routine annual exam.  Current complaints: ***  Patient's last menstrual period was 10/21/2006.   The pregnancy intention screening data noted above was reviewed. Potential methods of contraception were discussed. The patient elected to proceed with No data recorded.   Last pap 06/22/2023. Results were: NILM w/ HRHPV negative. H/O abnormal pap: {yes/yes***/no:23866} Last mammogram: 04/25/2024. Results were: normal. Family h/o breast cancer: {yes***/no:23838} Last colonoscopy: 11/11/2021 Cologuard. Results were: normal. Family h/o colorectal cancer: {yes***/no:23838}     06/22/2023   10:13 AM  Depression screen PHQ 2/9  Decreased Interest 1  Down, Depressed, Hopeless 1  PHQ - 2 Score 2  Altered sleeping 1  Tired, decreased energy 1  Change in appetite 0  Feeling bad or failure about yourself  0  Trouble concentrating 1  Moving slowly or fidgety/restless 0  Suicidal thoughts 0  PHQ-9 Score 5      Data saved with a previous flowsheet row definition         No data to display           Review of Systems:   Pertinent items are noted in HPI Denies any headaches, blurred vision, fatigue, shortness of breath, chest pain, abdominal pain, abnormal vaginal discharge/itching/odor/irritation, problems with periods, bowel movements, urination, or intercourse unless otherwise stated above. Pertinent History Reviewed:  Reviewed past medical,surgical, social and family history.  Reviewed problem list, medications and allergies. Physical Assessment:  There were no vitals filed for this visit.There is no height or weight on file to calculate BMI.        Physical Examination:   General appearance - well appearing, and in no distress  Mental  status - alert, oriented to person, place, and time  Psych:  She has a normal mood and affect  Skin - warm and dry, normal color, no suspicious lesions noted  Chest - effort normal, all lung fields clear to auscultation bilaterally  Heart - normal rate and regular rhythm  Neck:  midline trachea, no thyromegaly or nodules  Breasts - breasts appear normal, no suspicious masses, no skin or nipple changes or  axillary nodes  Abdomen - soft, nontender, nondistended, no masses or organomegaly  Pelvic - VULVA: normal appearing vulva with no masses, tenderness or lesions  VAGINA: normal appearing vagina with normal color and discharge, no lesions  CERVIX: normal appearing cervix without discharge or lesions, no CMT  Thin prep pap is {Desc; done/not:10129} *** HR HPV cotesting  UTERUS: uterus is felt to be normal size, shape, consistency and nontender   ADNEXA: No adnexal masses or tenderness noted.  Rectal - normal rectal, good sphincter tone, no masses felt. Hemoccult: ***  Extremities:  No swelling or varicosities noted  Chaperone present for exam  No results found for this or any previous visit (from the past 24 hours).  Assessment & Plan:  1) Well-Woman Exam  2) ***  Labs/procedures today: ***  Mammogram: {Mammo f/u:25212::@ 74yo}, or sooner if problems Colonoscopy: {TCS f/u:25213::@ 74yo}, or sooner if problems  No orders of the defined types were placed in this encounter.   Meds: No orders of the defined types were placed in this encounter.   Follow-up: No follow-ups on file.  Morna LOISE Quale, RN 12/10/2024  3:34 PM "

## 2024-12-11 ENCOUNTER — Ambulatory Visit (HOSPITAL_BASED_OUTPATIENT_CLINIC_OR_DEPARTMENT_OTHER): Payer: Medicare (Managed Care) | Admitting: Obstetrics & Gynecology

## 2024-12-11 ENCOUNTER — Encounter (HOSPITAL_BASED_OUTPATIENT_CLINIC_OR_DEPARTMENT_OTHER): Payer: Self-pay | Admitting: Obstetrics & Gynecology

## 2024-12-11 VITALS — BP 124/80 | HR 89 | Ht 66.0 in | Wt 166.4 lb

## 2024-12-11 DIAGNOSIS — Z8742 Personal history of other diseases of the female genital tract: Secondary | ICD-10-CM

## 2024-12-11 DIAGNOSIS — Z01419 Encounter for gynecological examination (general) (routine) without abnormal findings: Secondary | ICD-10-CM

## 2024-12-11 DIAGNOSIS — Z1331 Encounter for screening for depression: Secondary | ICD-10-CM

## 2024-12-11 DIAGNOSIS — Z780 Asymptomatic menopausal state: Secondary | ICD-10-CM

## 2024-12-11 DIAGNOSIS — N840 Polyp of corpus uteri: Secondary | ICD-10-CM

## 2024-12-11 DIAGNOSIS — M85852 Other specified disorders of bone density and structure, left thigh: Secondary | ICD-10-CM

## 2024-12-11 DIAGNOSIS — N952 Postmenopausal atrophic vaginitis: Secondary | ICD-10-CM

## 2024-12-11 DIAGNOSIS — R102 Pelvic and perineal pain unspecified side: Secondary | ICD-10-CM

## 2024-12-11 DIAGNOSIS — M85851 Other specified disorders of bone density and structure, right thigh: Secondary | ICD-10-CM

## 2024-12-17 NOTE — Progress Notes (Signed)
 "  Ultrasound f/u Patient name: Kristen Anderson MRN [id number]  Date of birth: Jun 06, 1951 Chief Complaint:   Follow-up  History of Present Illness:   Kristen Anderson is a 74 y.o. [phone] Caucasian female being seen today for discussion of ultrasound findings.  U/s done due to pelvic pain/lower abdominal pain that is sharp and intermittent in nature.  Denies vaginal bleeding.  Ultrasound today did not show any abnormal findings.  Uterus normal.  Endometrium thin.  Ovaries were not well visualized as there was bowel motion that interfered with good visualization of the adnexal region.  No clear masses or cysts were noted.  Given pain, in adequately visualized ovaries, will proceed with scheduling a CT.    She does have vaginal atrophy and I'm not sure if this is contributing but recommended starting vaginal estradiol  cream for treatment.    She does need RF for topical steroid used for lichen sclerosus as well.  She is accompanied by her husband today.  Patient's last menstrual period was 10/21/2006.   Last pap 06/22/2023. Results were: negative.  Review of Systems:   Pertinent items are noted in HPI Denies any urinary or bowel changes Pertinent History Reviewed:  Reviewed past medical,surgical, social and family history.  Reviewed problem list, medications and allergies. Physical Assessment:   Vitals:   12/18/24 1059  BP: 137/81  Pulse: 82  SpO2: 99%  There is no height or weight on file to calculate BMI.        Physical Examination:   General appearance - well appearing, and in no distress  Mental status - alert, oriented to person, place, and time  Psych:  She has a normal mood and affect    Assessment & Plan:  1. Pelvic pain in female (Primary) - CT ABDOMEN PELVIS W CONTRAST; Future  2. Vaginal atrophy - will start topical vaginal estrogen cream.  If she has any nipple tenderness, she needs to let me know - estradiol  (ESTRACE ) 0.01 % CREA vaginal cream; 1 gram vaginally  twice weekly  Dispense: 42.5 g; Refill: 1  3. Lichen sclerosus et atrophicus - clobetasol  ointment (TEMOVATE ) 0.05 %; Apply 1 Application topically 2 (two) times daily. Apply blueberry size amount of ointment topically as direct twice daily.  Dispense: 60 g; Refill: 1  4. Hemorrhoids, unspecified hemorrhoid type - hydrocortisone  (ANUSOL -HC) 2.5 % rectal cream; Place 1 Application rectally 2 (two) times daily. Use as needed for itching or pain.  Can use for up to 5 days at a time.  Dispense: 30 g; Refill: 1    Orders Placed This Encounter  Procedures   CT ABDOMEN PELVIS W CONTRAST    Meds:  Meds ordered this encounter  Medications   clobetasol  ointment (TEMOVATE ) 0.05 %    Sig: Apply 1 Application topically 2 (two) times daily. Apply blueberry size amount of ointment topically as direct twice daily.    Dispense:  60 g    Refill:  1   estradiol  (ESTRACE ) 0.01 % CREA vaginal cream    Sig: 1 gram vaginally twice weekly    Dispense:  42.5 g    Refill:  1   hydrocortisone  (ANUSOL -HC) 2.5 % rectal cream    Sig: Place 1 Application rectally 2 (two) times daily. Use as needed for itching or pain.  Can use for up to 5 days at a time.    Dispense:  30 g    Refill:  1    Ronal GORMAN Pinal, MD 12/22/2024 4:55 PM  GYNECOLOGY  VISIT    "

## 2024-12-18 ENCOUNTER — Other Ambulatory Visit (HOSPITAL_BASED_OUTPATIENT_CLINIC_OR_DEPARTMENT_OTHER): Payer: Self-pay | Admitting: Obstetrics & Gynecology

## 2024-12-18 ENCOUNTER — Ambulatory Visit (HOSPITAL_BASED_OUTPATIENT_CLINIC_OR_DEPARTMENT_OTHER): Payer: Medicare (Managed Care)

## 2024-12-18 ENCOUNTER — Ambulatory Visit (HOSPITAL_BASED_OUTPATIENT_CLINIC_OR_DEPARTMENT_OTHER): Payer: Self-pay | Admitting: Obstetrics & Gynecology

## 2024-12-18 ENCOUNTER — Encounter (HOSPITAL_BASED_OUTPATIENT_CLINIC_OR_DEPARTMENT_OTHER): Payer: Self-pay | Admitting: Obstetrics & Gynecology

## 2024-12-18 VITALS — BP 137/81 | HR 82

## 2024-12-18 DIAGNOSIS — R102 Pelvic and perineal pain unspecified side: Secondary | ICD-10-CM | POA: Diagnosis not present

## 2024-12-18 DIAGNOSIS — K649 Unspecified hemorrhoids: Secondary | ICD-10-CM

## 2024-12-18 DIAGNOSIS — L900 Lichen sclerosus et atrophicus: Secondary | ICD-10-CM

## 2024-12-18 DIAGNOSIS — Z8742 Personal history of other diseases of the female genital tract: Secondary | ICD-10-CM | POA: Diagnosis not present

## 2024-12-18 DIAGNOSIS — N952 Postmenopausal atrophic vaginitis: Secondary | ICD-10-CM

## 2024-12-18 DIAGNOSIS — N840 Polyp of corpus uteri: Secondary | ICD-10-CM

## 2024-12-18 MED ORDER — CLOBETASOL PROPIONATE 0.05 % EX OINT: 1.0000 | TOPICAL_OINTMENT | Freq: Two times a day (BID) | CUTANEOUS | 1 refills | Status: AC

## 2024-12-18 MED ORDER — ESTRADIOL 0.01 % VA CREA: TOPICAL_CREAM | VAGINAL | 1 refills | Status: AC

## 2024-12-18 MED ORDER — HYDROCORTISONE (PERIANAL) 2.5 % EX CREA: 1.0000 | TOPICAL_CREAM | Freq: Two times a day (BID) | CUTANEOUS | 1 refills | Status: AC

## 2024-12-24 MED ORDER — CLOBETASOL PROPIONATE 0.05 % EX OINT: TOPICAL_OINTMENT | CUTANEOUS | 1 refills | Status: AC

## 2024-12-24 NOTE — Addendum Note (Signed)
 Addended by: Maisen Klingler E on: 12/24/2024 05:19 PM   Modules accepted: Orders

## 2024-12-25 ENCOUNTER — Telehealth (HOSPITAL_BASED_OUTPATIENT_CLINIC_OR_DEPARTMENT_OTHER): Payer: Self-pay

## 2024-12-25 NOTE — Telephone Encounter (Signed)
 CT abd and pelvis w contrast approved via patient's insurance. Case ID [id number] Approved from 11/24/2024- 03/25/2025

## 2025-01-27 ENCOUNTER — Ambulatory Visit (HOSPITAL_BASED_OUTPATIENT_CLINIC_OR_DEPARTMENT_OTHER): Payer: Medicare (Managed Care) | Admitting: Obstetrics & Gynecology
